# Patient Record
Sex: Male | Born: 1955
Health system: Southern US, Community
[De-identification: ages and names within clinical notes are randomized; demographics above are authoritative.]

## PROBLEM LIST (undated history)

## (undated) DIAGNOSIS — G4700 Insomnia, unspecified: Secondary | ICD-10-CM

## (undated) DIAGNOSIS — Z973 Presence of spectacles and contact lenses: Secondary | ICD-10-CM

## (undated) DIAGNOSIS — T4145XA Adverse effect of unspecified anesthetic, initial encounter: Secondary | ICD-10-CM

## (undated) DIAGNOSIS — E039 Hypothyroidism, unspecified: Secondary | ICD-10-CM

## (undated) DIAGNOSIS — M5020 Other cervical disc displacement, unspecified cervical region: Secondary | ICD-10-CM

## (undated) DIAGNOSIS — K6289 Other specified diseases of anus and rectum: Secondary | ICD-10-CM

## (undated) DIAGNOSIS — E291 Testicular hypofunction: Secondary | ICD-10-CM

## (undated) DIAGNOSIS — T8859XA Other complications of anesthesia, initial encounter: Secondary | ICD-10-CM

## (undated) DIAGNOSIS — M5030 Other cervical disc degeneration, unspecified cervical region: Secondary | ICD-10-CM

## (undated) DIAGNOSIS — E785 Hyperlipidemia, unspecified: Secondary | ICD-10-CM

## (undated) DIAGNOSIS — D649 Anemia, unspecified: Secondary | ICD-10-CM

## (undated) DIAGNOSIS — K449 Diaphragmatic hernia without obstruction or gangrene: Secondary | ICD-10-CM

## (undated) DIAGNOSIS — M1990 Unspecified osteoarthritis, unspecified site: Secondary | ICD-10-CM

## (undated) DIAGNOSIS — N529 Male erectile dysfunction, unspecified: Secondary | ICD-10-CM

## (undated) HISTORY — PX: TONSILLECTOMY: SUR1361

## (undated) HISTORY — PX: TOTAL HIP ARTHROPLASTY: SHX124

## (undated) HISTORY — PX: CARDIOVASCULAR STRESS TEST: SHX262

---

## 2016-05-22 ENCOUNTER — Other Ambulatory Visit: Payer: Self-pay | Admitting: Family Medicine

## 2016-05-22 ENCOUNTER — Other Ambulatory Visit (HOSPITAL_COMMUNITY): Payer: Self-pay | Admitting: Family Medicine

## 2016-05-22 DIAGNOSIS — R1011 Right upper quadrant pain: Secondary | ICD-10-CM

## 2016-05-23 ENCOUNTER — Ambulatory Visit (HOSPITAL_COMMUNITY): Payer: 59

## 2016-05-26 ENCOUNTER — Other Ambulatory Visit: Payer: Self-pay

## 2016-05-31 ENCOUNTER — Ambulatory Visit (HOSPITAL_COMMUNITY)
Admission: RE | Admit: 2016-05-31 | Discharge: 2016-05-31 | Disposition: A | Discharge status: Home / Self Care | Payer: 59 | Source: Ambulatory Visit | Attending: Family Medicine | Admitting: Family Medicine

## 2016-05-31 DIAGNOSIS — R1013 Epigastric pain: Secondary | ICD-10-CM | POA: Diagnosis not present

## 2016-05-31 DIAGNOSIS — R1011 Right upper quadrant pain: Secondary | ICD-10-CM

## 2016-07-03 DIAGNOSIS — R6881 Early satiety: Secondary | ICD-10-CM | POA: Diagnosis not present

## 2016-07-03 DIAGNOSIS — R110 Nausea: Secondary | ICD-10-CM | POA: Diagnosis not present

## 2016-07-03 DIAGNOSIS — R1013 Epigastric pain: Secondary | ICD-10-CM | POA: Diagnosis not present

## 2016-07-03 DIAGNOSIS — R197 Diarrhea, unspecified: Secondary | ICD-10-CM | POA: Diagnosis not present

## 2016-07-03 DIAGNOSIS — R140 Abdominal distension (gaseous): Secondary | ICD-10-CM | POA: Diagnosis not present

## 2016-07-06 DIAGNOSIS — R197 Diarrhea, unspecified: Secondary | ICD-10-CM | POA: Diagnosis not present

## 2016-07-06 DIAGNOSIS — E785 Hyperlipidemia, unspecified: Secondary | ICD-10-CM | POA: Diagnosis not present

## 2016-07-06 DIAGNOSIS — E039 Hypothyroidism, unspecified: Secondary | ICD-10-CM | POA: Diagnosis not present

## 2016-07-06 DIAGNOSIS — E291 Testicular hypofunction: Secondary | ICD-10-CM | POA: Diagnosis not present

## 2016-07-06 DIAGNOSIS — R5383 Other fatigue: Secondary | ICD-10-CM | POA: Diagnosis not present

## 2016-07-06 MED FILL — LEVOTHYROXINE 25 MCG TABLET: 25 | 90 days supply | Qty: 90 | Fill #0

## 2016-07-07 MED FILL — TESTOSTERON CYP 2,000 MG/10: 200 | 28 days supply | Qty: 10 | Fill #0

## 2016-07-10 ENCOUNTER — Encounter (HOSPITAL_COMMUNITY): Payer: Self-pay | Admitting: *Deleted

## 2016-07-10 MED FILL — GAVILYTE-N SOLUTION: 420 | 1 days supply | Qty: 4000 | Fill #0

## 2016-07-10 NOTE — Progress Notes (Signed)
Pt denies cardiac history or chest pain. States he sometimes has shortness of breath, but can't relate it to anything specific. States it doesn't happen often.

## 2016-07-11 ENCOUNTER — Ambulatory Visit (HOSPITAL_COMMUNITY): Payer: 59 | Admitting: Certified Registered Nurse Anesthetist

## 2016-07-11 ENCOUNTER — Encounter (HOSPITAL_COMMUNITY): Payer: Self-pay

## 2016-07-11 ENCOUNTER — Encounter (HOSPITAL_COMMUNITY)
Admission: RE | Disposition: A | Discharge status: Home / Self Care | Payer: Self-pay | Source: Ambulatory Visit | Attending: Gastroenterology

## 2016-07-11 ENCOUNTER — Ambulatory Visit (HOSPITAL_COMMUNITY)
Admission: RE | Admit: 2016-07-11 | Discharge: 2016-07-11 | Disposition: A | Discharge status: Home / Self Care | Payer: 59 | Source: Ambulatory Visit | Attending: Gastroenterology | Admitting: Gastroenterology

## 2016-07-11 ENCOUNTER — Other Ambulatory Visit: Payer: Self-pay | Admitting: Gastroenterology

## 2016-07-11 DIAGNOSIS — Z79899 Other long term (current) drug therapy: Secondary | ICD-10-CM | POA: Insufficient documentation

## 2016-07-11 DIAGNOSIS — K6289 Other specified diseases of anus and rectum: Secondary | ICD-10-CM | POA: Insufficient documentation

## 2016-07-11 DIAGNOSIS — K3189 Other diseases of stomach and duodenum: Secondary | ICD-10-CM | POA: Diagnosis not present

## 2016-07-11 DIAGNOSIS — Z7982 Long term (current) use of aspirin: Secondary | ICD-10-CM | POA: Diagnosis not present

## 2016-07-11 DIAGNOSIS — R197 Diarrhea, unspecified: Secondary | ICD-10-CM | POA: Diagnosis not present

## 2016-07-11 DIAGNOSIS — R1013 Epigastric pain: Secondary | ICD-10-CM | POA: Insufficient documentation

## 2016-07-11 DIAGNOSIS — K2960 Other gastritis without bleeding: Secondary | ICD-10-CM | POA: Diagnosis not present

## 2016-07-11 HISTORY — PX: ESOPHAGOGASTRODUODENOSCOPY (EGD) WITH PROPOFOL: SHX5813

## 2016-07-11 HISTORY — DX: Anemia, unspecified: D64.9

## 2016-07-11 HISTORY — PX: COLONOSCOPY WITH PROPOFOL: SHX5780

## 2016-07-11 HISTORY — DX: Other complications of anesthesia, initial encounter: T88.59XA

## 2016-07-11 HISTORY — DX: Adverse effect of unspecified anesthetic, initial encounter: T41.45XA

## 2016-07-11 HISTORY — DX: Unspecified osteoarthritis, unspecified site: M19.90

## 2016-07-11 SURGERY — ESOPHAGOGASTRODUODENOSCOPY (EGD) WITH PROPOFOL
Anesthesia: Monitor Anesthesia Care

## 2016-07-11 MED ORDER — LACTATED RINGERS IV SOLN
INTRAVENOUS | Status: DC
  Administered 2016-07-11: 1000 mL via INTRAVENOUS

## 2016-07-11 MED ORDER — BUTAMBEN-TETRACAINE-BENZOCAINE 2-2-14 % EX AERO
INHALATION_SPRAY | CUTANEOUS | Status: DC | PRN
  Administered 2016-07-11: 2 via TOPICAL

## 2016-07-11 MED ORDER — SODIUM CHLORIDE 0.9 % IV SOLN: INTRAVENOUS | Status: DC

## 2016-07-11 MED ORDER — PHENYLEPHRINE HCL 10 MG/ML IJ SOLN
INTRAMUSCULAR | Status: DC | PRN
  Administered 2016-07-11 (×2): 40 ug via INTRAVENOUS
  Administered 2016-07-11: 80 ug via INTRAVENOUS
  Administered 2016-07-11: 40 ug via INTRAVENOUS

## 2016-07-11 MED ORDER — LIDOCAINE HCL (CARDIAC) 20 MG/ML IV SOLN
INTRAVENOUS | Status: DC | PRN
  Administered 2016-07-11: 40 mg via INTRAVENOUS

## 2016-07-11 MED ORDER — PROPOFOL 500 MG/50ML IV EMUL
INTRAVENOUS | Status: DC | PRN
  Administered 2016-07-11: 150 ug/kg/min via INTRAVENOUS

## 2016-07-11 MED ORDER — PROPOFOL 10 MG/ML IV BOLUS
INTRAVENOUS | Status: DC | PRN
  Administered 2016-07-11 (×2): 50 mg via INTRAVENOUS

## 2016-07-11 NOTE — Discharge Instructions (Signed)
Colonoscopy, Care After °These instructions give you information on caring for yourself after your procedure. Your doctor may also give you more specific instructions. Call your doctor if you have any problems or questions after your procedure. °HOME CARE °· Do not drive for 24 hours. °· Do not sign important papers or use machinery for 24 hours. °· You may shower. °· You may go back to your usual activities, but go slower for the first 24 hours. °· Take rest breaks often during the first 24 hours. °· Walk around or use warm packs on your belly (abdomen) if you have belly cramping or gas. °· Drink enough fluids to keep your pee (urine) clear or pale yellow. °· Resume your normal diet. Avoid heavy or fried foods. °· Avoid drinking alcohol for 24 hours or as told by your doctor. °· Only take medicines as told by your doctor. °If a tissue sample (biopsy) was taken during the procedure:  °· Do not take aspirin or blood thinners for 7 days, or as told by your doctor. °· Do not drink alcohol for 7 days, or as told by your doctor. °· Eat soft foods for the first 24 hours. °GET HELP IF: °You still have a small amount of blood in your poop (stool) 2-3 days after the procedure. °GET HELP RIGHT AWAY IF: °· You have more than a small amount of blood in your poop. °· You see clumps of tissue (blood clots) in your poop. °· Your belly is puffy (swollen). °· You feel sick to your stomach (nauseous) or throw up (vomit). °· You have a fever. °· You have belly pain that gets worse and medicine does not help. °MAKE SURE YOU: °· Understand these instructions. °· Will watch your condition. °· Will get help right away if you are not doing well or get worse. °  °This information is not intended to replace advice given to you by your health care provider. Make sure you discuss any questions you have with your health care provider. °  °Document Released: 12/23/2010 Document Revised: 11/25/2013 Document Reviewed: 07/28/2013 °Elsevier  Interactive Patient Education ©2016 [employer] °Esophagogastroduodenoscopy, Care After °Refer to this sheet in the next few weeks. These instructions provide you with information about caring for yourself after your procedure. Your health care provider may also give you more specific instructions. Your treatment has been planned according to current medical practices, but problems sometimes occur. Call your health care provider if you have any problems or questions after your procedure. °WHAT TO EXPECT AFTER THE PROCEDURE °After your procedure, it is typical to feel: °· Soreness in your throat. °· Pain with swallowing. °· Sick to your stomach (nauseous). °· Bloated. °· Dizzy. °· Fatigued. °HOME CARE INSTRUCTIONS °· Do not eat or drink anything until the numbing medicine (local anesthetic) has worn off and your gag reflex has returned. You will know that the local anesthetic has worn off when you can swallow comfortably. °· Do not drive or operate machinery until directed by your health care provider. °· Take medicines only as directed by your health care provider. °SEEK MEDICAL CARE IF:  °· You cannot stop coughing. °· You are not urinating at all or less than usual. °SEEK IMMEDIATE MEDICAL CARE IF: °· You have difficulty swallowing. °· You cannot eat or drink. °· You have worsening throat or chest pain. °· You have dizziness or lightheadedness or you faint. °· You have nausea or vomiting. °· You have chills. °· You have a fever. °·   You have severe abdominal pain. °· You have black, tarry, or bloody stools. °  °This information is not intended to replace advice given to you by your health care provider. Make sure you discuss any questions you have with your health care provider. °  °Document Released: 11/06/2012 Document Revised: 12/11/2014 Document Reviewed: 11/06/2012 °Elsevier Interactive Patient Education ©2016 [employer] ° °

## 2016-07-11 NOTE — Op Note (Signed)
[facility] - Inpatient Patient Name: Carl Hogan Procedure Date : 07/11/2016 MRN: [id number] Attending MD: Wonda Horner , MD Date of Birth: 1956/03/12 CSN: [id number] Age: 60 Admit Type: Outpatient Procedure:                Upper GI endoscopy Indications:              Epigastric abdominal pain Providers:                Wonda Horner, MD, Cleda Daub, RN, Alfonso Patten,                            Technician, Cira Servant, CRNA Referring MD:              Medicines:                Propofol per Anesthesia Complications:            No immediate complications. Estimated Blood Loss:     Estimated blood loss was minimal. Procedure:                Pre-Anesthesia Assessment:                           - Prior to the procedure, a History and Physical                            was performed, and patient medications and                            allergies were reviewed. [name] tolerance of                            previous anesthesia was also reviewed. The risks                            and benefits of the procedure and the sedation                            options and risks were discussed with the patient.                            All questions were answered, and informed consent                            was obtained. Prior Anticoagulants: The patient has                            taken no previous anticoagulant or antiplatelet                            agents. ASA Grade Assessment: II - A patient with                            mild systemic disease. After reviewing the risks  and benefits, the patient was deemed in                            satisfactory condition to undergo the procedure.                           After obtaining informed consent, the endoscope was                            passed under direct vision. Throughout the                            procedure, the patient's blood pressure, pulse, and   oxygen saturations were monitored continuously. The                            [id number]) scope was introduced through the                            mouth, and advanced to the second part of duodenum.                            The upper GI endoscopy was accomplished without                            difficulty. The patient tolerated the procedure                            well. Scope In: Scope Out: Findings:      The examined esophagus was normal.      Mildly erythematous mucosa was found in the gastric antrum. Biopsies       were taken with a cold forceps for histology.      The examined duodenum was normal. Biopsies were taken with a cold       forceps for histology. Impression:               - Normal esophagus.                           - Erythematous mucosa in the antrum. Biopsied.                           - Normal examined duodenum. Biopsied. Moderate Sedation:      . Recommendation:           - Resume regular diet.                           - Continue present medications.                           - Await pathology results.                           - Return to physician assistant as previously  scheduled. Procedure Code(s):        --- Professional ---                           [phone], Esophagogastroduodenoscopy, flexible,                            transoral; with biopsy, single or multiple Diagnosis Code(s):        --- Professional ---                           K31.89, Other diseases of stomach and duodenum                           R10.13, Epigastric pain CPT copyright 2016 [employer]. All rights reserved. The codes documented in this report are preliminary and upon coder review may  be revised to meet current compliance requirements. Wonda Horner, MD 07/11/2016 9:28:23 AM This report has been signed electronically. Number of Addenda: 0

## 2016-07-11 NOTE — Anesthesia Preprocedure Evaluation (Signed)
Anesthesia Evaluation  Patient identified by MRN, date of birth, ID band Patient awake    Reviewed: Allergy & Precautions, NPO status , Patient's Chart, lab work & pertinent test results  Airway Mallampati: II  TM Distance: >3 FB Neck ROM: Full    Dental no notable dental hx.    Pulmonary shortness of breath,    Pulmonary exam normal breath sounds clear to auscultation       Cardiovascular negative cardio ROS Normal cardiovascular exam Rhythm:Regular Rate:Normal     Neuro/Psych negative neurological ROS  negative psych ROS   GI/Hepatic Neg liver ROS, hiatal hernia,   Endo/Other  negative endocrine ROS  Renal/GU negative Renal ROS     Musculoskeletal  (+) Arthritis ,   Abdominal   Peds  Hematology negative hematology ROS (+) anemia ,   Anesthesia Other Findings   Reproductive/Obstetrics negative OB ROS                             Anesthesia Physical Anesthesia Plan  ASA: III  Anesthesia Plan: MAC   Post-op Pain Management:    Induction: Intravenous  Airway Management Planned:   Additional Equipment:   Intra-op Plan:   Post-operative Plan:   Informed Consent: I have reviewed the patients History and Physical, chart, labs and discussed the procedure including the risks, benefits and alternatives for the proposed anesthesia with the patient or authorized representative who has indicated his/her understanding and acceptance.   Dental advisory given  Plan Discussed with: CRNA  Anesthesia Plan Comments:         Anesthesia Quick Evaluation

## 2016-07-11 NOTE — H&P (Signed)
  Patient came to endo for EGD and colonoscopy for epigastric pain and diarrhea. History reviewed. PE: alert, no distress. Heart RRR, Lungs clear, Abdomen Non tender. Plan, EGD and colonoscopy

## 2016-07-11 NOTE — Anesthesia Postprocedure Evaluation (Signed)
Anesthesia Post Note  Patient: Carl Hogan  Procedure(s) Performed: Procedure(s) (LRB): ESOPHAGOGASTRODUODENOSCOPY (EGD) WITH PROPOFOL (N/A) COLONOSCOPY WITH PROPOFOL (N/A)  Patient location during evaluation: PACU Anesthesia Type: MAC Level of consciousness: awake and alert Pain management: pain level controlled Vital Signs Assessment: post-procedure vital signs reviewed and stable Respiratory status: spontaneous breathing Cardiovascular status: stable Anesthetic complications: no    Last Vitals:  Vitals:   07/11/16 1000 07/11/16 1006  BP: 114/73 126/77  Pulse: 66 72  Resp: 12 11  Temp:      Last Pain:  Vitals:   07/11/16 0936  TempSrc: Oral                 Nolon Nations

## 2016-07-11 NOTE — Transfer of Care (Signed)
Immediate Anesthesia Transfer of Care Note  Patient: Carl Hogan  Procedure(s) Performed: Procedure(s): ESOPHAGOGASTRODUODENOSCOPY (EGD) WITH PROPOFOL (N/A) COLONOSCOPY WITH PROPOFOL (N/A)  Patient Location: PACU and Endoscopy Unit  Anesthesia Type:MAC  Level of Consciousness: awake, alert , oriented and patient cooperative  Airway & Oxygen Therapy: Patient Spontanous Breathing and Patient connected to nasal cannula oxygen  Post-op Assessment: Report given to RN and Post -op Vital signs reviewed and stable  Post vital signs: Reviewed and stable  Last Vitals:  Vitals:   07/11/16 0755  BP: 130/72  Pulse: 75  Resp: 13    Last Pain: There were no vitals filed for this visit.       Complications: No apparent anesthesia complications

## 2016-07-11 NOTE — Op Note (Signed)
[facility] Patient Name: Carl Hogan Procedure Date : 07/11/2016 MRN: [id number] Attending MD: Wonda Horner , MD Date of Birth: [birth year] CSN: [id number] Age: 60 Admit Type: Outpatient Procedure:                Colonoscopy Indications:              Clinically significant diarrhea of unexplained                            origin Providers:                Wonda Horner, MD, Cleda Daub, RN, Alfonso Patten,                            Technician, Cira Servant, CRNA Referring MD:              Medicines:                Propofol per Anesthesia Complications:            No immediate complications. Estimated Blood Loss:     Estimated blood loss was minimal. Procedure:                Pre-Anesthesia Assessment:                           - Prior to the procedure, a History and Physical                            was performed, and patient medications and                            allergies were reviewed. [name] tolerance of                            previous anesthesia was also reviewed. The risks                            and benefits of the procedure and the sedation                            options and risks were discussed with the patient.                            All questions were answered, and informed consent                            was obtained. Prior Anticoagulants: The patient has                            taken no previous anticoagulant or antiplatelet                            agents. ASA Grade Assessment: II - A patient with  mild systemic disease. After reviewing the risks                            and benefits, the patient was deemed in                            satisfactory condition to undergo the procedure.                           - Prior to the procedure, a History and Physical                            was performed, and patient medications and                            allergies were reviewed. The  patient's tolerance of                            previous anesthesia was also reviewed. The risks                            and benefits of the procedure and the sedation                            options and risks were discussed with the patient.                            All questions were answered, and informed consent                            was obtained. Prior Anticoagulants: The patient has                            taken no previous anticoagulant or antiplatelet                            agents. ASA Grade Assessment: II - A patient with                            mild systemic disease. After reviewing the risks                            and benefits, the patient was deemed in                            satisfactory condition to undergo the procedure.                           After obtaining informed consent, the colonoscope                            was passed under direct vision. Throughout the  procedure, the patient's blood pressure, pulse, and                            oxygen saturations were monitored continuously. The                            [id number] scope was introduced through                            the anus and advanced to the the cecum, identified                            by appendiceal orifice and ileocecal valve. The                            ileocecal valve, appendiceal orifice, and rectum                            were photographed. The quality of the bowel                            preparation was good. Scope In: 9:10:50 AM Scope Out: 9:21:24 AM Scope Withdrawal Time: 0 hours 7 minutes 18 seconds  Total Procedure Duration: 0 hours 10 minutes 34 seconds  Findings:      The digital rectal exam was abnormal. There was a soft rubbery nodule in       the anal canal      The colon (entire examined portion) appeared normal. Biopsies for       histology were taken with a cold forceps from the randomly for        evaluation of microscopic colitis.      Nodule in anal canal. Impression:               - Abnormal digital rectal exam.                           - The entire examined colon is normal. Biopsied. Moderate Sedation:      . Recommendation:           - Resume regular diet.                           - Continue present medications.                           - Await pathology results.                           - Repeat colonoscopy in 10 years for screening                            purposes. Procedure Code(s):        --- Professional ---                           [phone], Colonoscopy, flexible; with biopsy, single  or multiple Diagnosis Code(s):        --- Professional ---                           K62.89, Other specified diseases of anus and rectum                           R19.7, Diarrhea, unspecified CPT copyright 2016 [employer]. All rights reserved. The codes documented in this report are preliminary and upon coder review may  be revised to meet current compliance requirements. Wonda Horner, MD 07/11/2016 9:52:46 AM This report has been signed electronically. Number of Addenda: 0

## 2016-07-22 ENCOUNTER — Encounter (HOSPITAL_COMMUNITY): Payer: Self-pay

## 2016-07-22 ENCOUNTER — Emergency Department (HOSPITAL_COMMUNITY)
Admission: EM | Admit: 2016-07-22 | Discharge: 2016-07-23 | Disposition: A | Discharge status: Home / Self Care | Payer: 59 | Attending: Emergency Medicine | Admitting: Emergency Medicine

## 2016-07-22 ENCOUNTER — Emergency Department (HOSPITAL_COMMUNITY): Payer: 59

## 2016-07-22 DIAGNOSIS — R05 Cough: Secondary | ICD-10-CM | POA: Diagnosis not present

## 2016-07-22 DIAGNOSIS — R0602 Shortness of breath: Secondary | ICD-10-CM | POA: Insufficient documentation

## 2016-07-22 DIAGNOSIS — Z7982 Long term (current) use of aspirin: Secondary | ICD-10-CM | POA: Insufficient documentation

## 2016-07-22 DIAGNOSIS — R0789 Other chest pain: Secondary | ICD-10-CM | POA: Diagnosis not present

## 2016-07-22 DIAGNOSIS — R072 Precordial pain: Secondary | ICD-10-CM | POA: Diagnosis not present

## 2016-07-22 DIAGNOSIS — Z79899 Other long term (current) drug therapy: Secondary | ICD-10-CM | POA: Diagnosis not present

## 2016-07-22 DIAGNOSIS — R079 Chest pain, unspecified: Secondary | ICD-10-CM | POA: Diagnosis not present

## 2016-07-22 DIAGNOSIS — R110 Nausea: Secondary | ICD-10-CM | POA: Insufficient documentation

## 2016-07-22 LAB — BASIC METABOLIC PANEL
ANION GAP: 7 (ref 5–15)
BUN: 19 mg/dL (ref 6–20)
CALCIUM: 9.6 mg/dL (ref 8.9–10.3)
CO2: 25 mmol/L (ref 22–32)
Chloride: 105 mmol/L (ref 101–111)
Creatinine, Ser: 1.31 mg/dL — ABNORMAL HIGH (ref 0.61–1.24)
GFR calc non Af Amer: 58 mL/min — ABNORMAL LOW (ref >60)
Glucose, Bld: 103 mg/dL — ABNORMAL HIGH (ref 65–99)
Potassium: 3.7 mmol/L (ref 3.5–5.1)
SODIUM: 137 mmol/L (ref 135–145)

## 2016-07-22 LAB — D-DIMER, QUANTITATIVE (NOT AT ARMC)

## 2016-07-22 LAB — CBC
HCT: 44.5 % (ref 39.0–52.0)
HEMOGLOBIN: 14.9 g/dL (ref 13.0–17.0)
MCH: 29.3 pg (ref 26.0–34.0)
MCHC: 33.5 g/dL (ref 30.0–36.0)
MCV: 87.6 fL (ref 78.0–100.0)
Platelets: 244 10*3/uL (ref 150–400)
RBC: 5.08 MIL/uL (ref 4.22–5.81)
RDW: 12.7 % (ref 11.5–15.5)
WBC: 7.2 10*3/uL (ref 4.0–10.5)

## 2016-07-22 LAB — I-STAT TROPONIN, ED: TROPONIN I, POC: 0.01 ng/mL (ref 0.00–0.08)

## 2016-07-22 MED ORDER — ASPIRIN 81 MG PO CHEW
324.0000 mg | CHEWABLE_TABLET | Freq: Once | ORAL | Status: AC
  Administered 2016-07-22: 324 mg via ORAL
  Filled 2016-07-22: qty 4

## 2016-07-22 NOTE — ED Provider Notes (Signed)
[facility] DEPT Provider Note   CSN: [id number] Arrival date & time: 07/22/16  1916     History   Chief Complaint Chief Complaint  Patient presents with  . Chest Pain    HPI Carl Hogan is a 60 y.o. male.  The history is provided by the patient.  Chest Pain   This is a new problem. The current episode started 3 to 5 hours ago. Episode frequency: fluctuating. The problem has been gradually improving. The pain is associated with rest. The pain is present in the substernal region. The pain radiates to the left shoulder and left arm. Associated symptoms include cough (non-productive), nausea and shortness of breath. Pertinent negatives include no abdominal pain, no back pain, no diaphoresis, no headaches, no irregular heartbeat and no lower extremity edema. Risk factors include male gender.  Pertinent negatives for past medical history include no CAD, no CHF, no diabetes, no DVT, no hyperlipidemia, no hypertension, no MI and no strokes.  Procedure history is positive for exercise treadmill test (2007; negative).  Procedure history is negative for cardiac catheterization.   Patient also reported a recent EGD 8 days ago without any complications the days to follow.   Past Medical History:  Diagnosis Date  . Anemia   . Arthritis   . Complication of anesthesia    was in recovery had Morphine and has anaphylactic reaction. Also slow to wake up  . Diarrhea   . History of hiatal hernia   . Shortness of breath dyspnea    occasional    There are no active problems to display for this patient.   Past Surgical History:  Procedure Laterality Date  . COLONOSCOPY    . COLONOSCOPY WITH PROPOFOL N/A 07/11/2016   Procedure: COLONOSCOPY WITH PROPOFOL;  Surgeon: Wonda Horner, MD;  Location[facility] ENDOSCOPY;  Service: Endoscopy;  Laterality: N/A;  . ESOPHAGOGASTRODUODENOSCOPY (EGD) WITH PROPOFOL N/A 07/11/2016   Procedure: ESOPHAGOGASTRODUODENOSCOPY (EGD) WITH PROPOFOL;  Surgeon: Wonda Horner, MD;  Location: [facility] ENDOSCOPY;  Service: Endoscopy;  Laterality: N/A;  . JOINT REPLACEMENT Bilateral    hips  . TONSILLECTOMY         Home Medications    Prior to Admission medications   Medication Sig Start Date End Date Taking? Authorizing Provider  aspirin 81 MG tablet Take 81 mg by mouth daily.   Yes Historical Provider, MD  b complex vitamins tablet Take 1 tablet by mouth daily.   Yes Historical Provider, MD  Calcium-Magnesium-Vitamin D (CALCIUM MAGNESIUM PO) Take 1 tablet by mouth daily.   Yes Historical Provider, MD  Cholecalciferol (VITAMIN D3) 5000 units TABS Take 1 tablet by mouth every morning.    Yes Historical Provider, MD  COPPER PO Take 1 mg by mouth daily.   Yes Historical Provider, MD  levothyroxine (SYNTHROID, LEVOTHROID) 25 MCG tablet Take 25 mcg by mouth daily before breakfast.   Yes Historical Provider, MD  omega-3 acid ethyl esters (LOVAZA) 1 g capsule Take 1 g by mouth daily.   Yes Historical Provider, MD  tadalafil (CIALIS) 20 MG tablet Take 20 mg by mouth daily as needed for erectile dysfunction.   Yes Historical Provider, MD  testosterone cypionate (DEPOTESTOTERONE CYPIONATE) 100 MG/ML injection Inject 100 mg into the muscle once a week. For IM use only; inject on Mondays   Yes Historical Provider, MD  Zinc Sulfate (ZINC 15 PO) Take 1 tablet by mouth daily.   Yes Historical Provider, MD  zolpidem (AMBIEN) 5 MG tablet Take 5 mg  by mouth at bedtime as needed for sleep.   Yes Historical Provider, MD    Family History Family History  Problem Relation Age of Onset  . Edema Mother   . Heart disease Father     Social History Social History  Substance Use Topics  . Smoking status: Never Smoker  . Smokeless tobacco: Never Used  . Alcohol use 3.0 oz/week    5 Glasses of wine per week     Allergies   Morphine and related and Lactose intolerance (gi)   Review of Systems Review of Systems  Constitutional: Negative for diaphoresis.  HENT: Negative  for congestion and rhinorrhea.   Respiratory: Positive for cough (non-productive) and shortness of breath.   Cardiovascular: Positive for chest pain.  Gastrointestinal: Positive for nausea. Negative for abdominal pain and diarrhea.  Genitourinary: Negative for difficulty urinating.  Musculoskeletal: Negative for back pain.  Neurological: Negative for headaches.  All other systems reviewed and are negative.    Physical Exam Updated Vital Signs BP 151/93 (BP Location: Right Arm)   Pulse 85   Temp 99 F (37.2 C) (Oral)   Resp 16   Ht 5\' 10"  (1.778 m)   Wt 205 lb (93 kg)   SpO2 96%   BMI 29.41 kg/m   Physical Exam  Constitutional: He is oriented to person, place, and time. He appears well-nourished. No distress.  HENT:  Head: Normocephalic and atraumatic.  Right Ear: External ear normal.  Left Ear: External ear normal.  Eyes: Pupils are equal, round, and reactive to light. Right eye exhibits no discharge. Left eye exhibits no discharge. No scleral icterus.  Neck: Normal range of motion. Neck supple.  Cardiovascular: Normal rate.  Exam reveals no gallop and no friction rub.   No murmur heard. Pulmonary/Chest: Effort normal and breath sounds normal. No stridor. No respiratory distress. He has no wheezes. He has no rales. He exhibits no tenderness.  Abdominal: Soft. He exhibits no distension and no mass. There is no tenderness. There is no rebound and no guarding.  Musculoskeletal: He exhibits no edema or tenderness.  Neurological: He is alert and oriented to person, place, and time.  Skin: Skin is warm and dry. No rash noted. He is not diaphoretic. No erythema.     ED Treatments / Results  Labs (all labs ordered are listed, but only abnormal results are displayed) Labs Reviewed  BASIC METABOLIC PANEL - Abnormal; Notable for the following:       Result Value   Glucose, Bld 103 (*)    Creatinine, Ser 1.31 (*)    GFR calc non Af Amer 58 (*)    All other components within  normal limits  CBC  D-DIMER, QUANTITATIVE (NOT AT [facility])  I-STAT TROPOININ, ED  I-STAT TROPOININ, ED    EKG  EKG Interpretation  Date/Time:  Saturday July 22 2016 19:25:03 EDT Ventricular Rate:  89 PR Interval:    QRS Duration: 104 QT Interval:  343 QTC Calculation: 418 R Axis:   73 Text Interpretation:  Sinus rhythm Low voltage, precordial leads no prior tracing to compare Confirmed by [facility] MD, PEDRO ([id number]) on 07/22/2016 9:17:01 PM       Radiology Dg Chest 2 View  Result Date: 07/22/2016 CLINICAL DATA:  Chest pain. EXAM: CHEST  2 VIEW COMPARISON:  None. FINDINGS: The heart size and mediastinal contours are within normal limits. Both lungs are clear. The visualized skeletal structures are unremarkable. IMPRESSION: No active cardiopulmonary disease. Electronically Signed   By: Shanon Brow  Jimmye Norman III M.D   On: 07/22/2016 20:10    Procedures Procedures (including critical care time)  Medications Ordered in ED Medications  aspirin chewable tablet 324 mg (324 mg Oral Given 07/22/16 2222)     Initial Impression / Assessment and Plan / ED Course  I have reviewed the triage vital signs and the nursing notes.  Pertinent labs & imaging results that were available during my care of the patient were reviewed by me and considered in my medical decision making (see chart for details).  Clinical Course    Patient is currently complaining only of mild pain and declined any nitroglycerin or pain medication. ASA given. EKG without acute ischemic changes. Chest x-ray without evidence suggestive of pneumonia, pneumothorax, pneumomediastinum.  No abnormal contour of the mediastinum to suggest dissection. No evidence of acute injuries. HEAR score of 3. Troponins negative 2.  Patient reports being on testosterone injections. Dimer negative. Low suspicion for pulmonary embolism.  Chest x-ray without any evidence of pneumomediastinum to suggest perforated esophagus secondary to the  EGD.  Presentation not classic for aortic dissection.  Patient is safe for discharge with strict return precautions. Patient is to follow-up with his primary care provider for further workup and repeat of a stress test.  Final Clinical Impressions(s) / ED Diagnoses   Final diagnoses:  Chest pain, unspecified chest pain type   Disposition: Discharge  Condition: Good  I have discussed the results, Dx and Tx plan with the patient who expressed understanding and agree(s) with the plan. Discharge instructions discussed at great length. The patient was given strict return precautions who verbalized understanding of the instructions. No further questions at time of discharge.    Current Discharge Medication List      Follow Up: Aretta Nip, MD [place] [address] [phone]  Schedule an appointment as soon as possible for a visit  Within the next 30 days to schedule stress test and further evaluate your chest pain.      Fatima Blank, MD 07/23/16 [phone]

## 2016-07-22 NOTE — ED Triage Notes (Signed)
PT C/O EPIGASTRIC PAIN RADIATING TO THE RIGHT AND LEFT CHEST WITH SOB AND NAUSEA X1.5 HRS AGO.

## 2016-07-23 DIAGNOSIS — Z7982 Long term (current) use of aspirin: Secondary | ICD-10-CM | POA: Diagnosis not present

## 2016-07-23 DIAGNOSIS — Z79899 Other long term (current) drug therapy: Secondary | ICD-10-CM | POA: Diagnosis not present

## 2016-07-23 DIAGNOSIS — R0602 Shortness of breath: Secondary | ICD-10-CM | POA: Diagnosis not present

## 2016-07-23 DIAGNOSIS — R0789 Other chest pain: Secondary | ICD-10-CM | POA: Diagnosis not present

## 2016-07-23 DIAGNOSIS — R110 Nausea: Secondary | ICD-10-CM | POA: Diagnosis not present

## 2016-07-23 DIAGNOSIS — R05 Cough: Secondary | ICD-10-CM | POA: Diagnosis not present

## 2016-07-23 LAB — I-STAT TROPONIN, ED: TROPONIN I, POC: 0.01 ng/mL (ref 0.00–0.08)

## 2016-07-26 DIAGNOSIS — M9903 Segmental and somatic dysfunction of lumbar region: Secondary | ICD-10-CM | POA: Diagnosis not present

## 2016-07-26 DIAGNOSIS — M5136 Other intervertebral disc degeneration, lumbar region: Secondary | ICD-10-CM | POA: Diagnosis not present

## 2016-07-26 DIAGNOSIS — K620 Anal polyp: Secondary | ICD-10-CM | POA: Diagnosis not present

## 2016-07-26 DIAGNOSIS — M50321 Other cervical disc degeneration at C4-C5 level: Secondary | ICD-10-CM | POA: Diagnosis not present

## 2016-07-26 DIAGNOSIS — M50123 Cervical disc disorder at C6-C7 level with radiculopathy: Secondary | ICD-10-CM | POA: Diagnosis not present

## 2016-07-26 DIAGNOSIS — M9901 Segmental and somatic dysfunction of cervical region: Secondary | ICD-10-CM | POA: Diagnosis not present

## 2016-07-26 DIAGNOSIS — M50323 Other cervical disc degeneration at C6-C7 level: Secondary | ICD-10-CM | POA: Diagnosis not present

## 2016-07-26 DIAGNOSIS — Z01818 Encounter for other preprocedural examination: Secondary | ICD-10-CM | POA: Diagnosis not present

## 2016-07-26 DIAGNOSIS — M9902 Segmental and somatic dysfunction of thoracic region: Secondary | ICD-10-CM | POA: Diagnosis not present

## 2016-07-26 DIAGNOSIS — M50322 Other cervical disc degeneration at C5-C6 level: Secondary | ICD-10-CM | POA: Diagnosis not present

## 2016-07-27 ENCOUNTER — Ambulatory Visit: Payer: Self-pay | Admitting: Surgery

## 2016-07-27 NOTE — H&P (Signed)
Dru Oppegard 07/26/2016 1:31 PM Location: [facility] Patient #: [id number] DOB: August 05, 1956 Married / Language: Cleophus Molt / Race: White Male  Patient Care Team: Aretta Nip, MD as PCP - General (Family Medicine) Michael Boston, MD as Consulting Physician (General Surgery) Wonda Horner, MD as Consulting Physician (Gastroenterology)   History of Present Illness Adin Hector MD; 07/27/2016 9:28 AM) The patient is a 60 year old male who presents with anal lesions. Note for "Anal lesions": Patient sent for surgical consultation by his gastroenterologist, Dr. Penelope Coop. Concern for anal canal mass. Possible need for removal.  Healthy active male. Rather physically active. Does not smoke. No prior surgeries. No prior anorectal issues. Underwent colonoscopy. A mass in the anal canal concerning. Pedunculated. Surgical consultation requested to consider removal of the suspicious mass. He patient usually has a bowel movement about three or four times a day. He walks about a half almost every day. No cardiopulmonary issues.  No personal nor family history of GI/colon cancer, inflammatory bowel disease, irritable bowel syndrome, allergy such as Celiac Sprue, dietary/dairy problems, colitis, ulcers nor gastritis. No recent sick contacts/gastroenteritis. No travel outside the country. No changes in diet. No dysphagia to solids or liquids. No significant heartburn or reflux. No hematochezia, hematemesis, coffee ground emesis. No evidence of prior gastric/peptic ulceration.   Other Problems Elbert Ewings, CMA; 07/26/2016 1:31 PM) Arthritis Back Pain Chest pain Gastric Ulcer Hypercholesterolemia Thyroid Disease  Past Surgical History Elbert Ewings, CMA; 07/26/2016 1:31 PM) Hip Surgery Bilateral.  Diagnostic Studies History Elbert Ewings, CMA; 07/26/2016 1:31 PM) Colonoscopy within last year  Allergies Elbert Ewings, CMA; 07/26/2016 1:31 PM) Morphine Sulfate IR  *ANALGESICS - OPIOID* Anaphylaxis.  Medication History Elbert Ewings, CMA; 07/26/2016 1:35 PM) Levothyroxine Sodium (50MCG Capsule, Oral) Active. Testosterone Cypionate (100MG /ML Solution, Intramuscular .5 ML once a week) Active. Cialis (20MG  Tablet, Oral) Active. Ambien (5MG  Tablet, Oral) Active. B Complex (Oral) Active. Niacin (100MG  Tablet, Oral) Active. Folic Acid (Q000111Q Tablet, Oral) Active. Biotin (1000MCG Tablet, Oral) Active. Zinc (15MG  Capsule, Oral) Active. Copper (5MG  Capsule, Oral) Active. Calcium (500MG  Tablet, Oral) Active. Magnesium (250MG  Tablet, Oral) Active. Coconut Oil (1000MG  Capsule, Oral) Active. Aspirin (81MG  Tablet, Oral) Active. Medications Reconciled  Social History Elbert Ewings, [place]; 07/26/2016 1:31 PM) Alcohol use Moderate alcohol use. Caffeine use Carbonated beverages, Coffee, Tea. No drug use Tobacco use Never smoker.  Family History Elbert Ewings, [place]; 07/26/2016 1:31 PM) Arthritis Father, Mother, Sister. Depression Sister.     Review of Systems Elbert Ewings CMA; 07/26/2016 1:31 PM) General Present- Fatigue and Weight Gain. Not Present- Appetite Loss, Chills, Fever, Night Sweats and Weight Loss. Skin Present- Dryness. Not Present- Change in Wart/Mole, Hives, Jaundice, New Lesions, Non-Healing Wounds, Rash and Ulcer. HEENT Present- Hoarseness, Ringing in the Ears and Wears glasses/contact lenses. Not Present- Earache, Hearing Loss, Nose Bleed, Oral Ulcers, Seasonal Allergies, Sinus Pain, Sore Throat, Visual Disturbances and Yellow Eyes. Respiratory Present- Chronic Cough and Snoring. Not Present- Bloody sputum, Difficulty Breathing and Wheezing. Breast Not Present- Breast Mass, Breast Pain, Nipple Discharge and Skin Changes. Cardiovascular Present- Chest Pain, Shortness of Breath and Swelling of Extremities. Not Present- Difficulty Breathing Lying Down, Leg Cramps, Palpitations and Rapid Heart Rate. Gastrointestinal Present-  Abdominal Pain, Bloating, Gets full quickly at meals and Nausea. Not Present- Bloody Stool, Change in Bowel Habits, Chronic diarrhea, Constipation, Difficulty Swallowing, Excessive gas, Hemorrhoids, Indigestion, Rectal Pain and Vomiting. Male Genitourinary Not Present- Blood in Urine, Change in Urinary Stream, Frequency, Impotence, Nocturia, Painful Urination, Urgency and Urine  Leakage. Musculoskeletal Present- Back Pain, Joint Pain, Joint Stiffness, Muscle Pain, Muscle Weakness and Swelling of Extremities. Neurological Present- Headaches, Tingling and Trouble walking. Not Present- Decreased Memory, Fainting, Numbness, Seizures, Tremor and Weakness. Psychiatric Present- Anxiety. Not Present- Bipolar, Change in Sleep Pattern, Depression, Fearful and Frequent crying. Endocrine Present- Cold Intolerance. Not Present- Excessive Hunger, Hair Changes, Heat Intolerance, Hot flashes and New Diabetes. Hematology Not Present- Blood Thinners, Easy Bruising, Excessive bleeding, Gland problems, HIV and Persistent Infections.  Vitals Elbert Ewings CMA; 07/26/2016 1:36 PM) 07/26/2016 1:35 PM Weight: 205 lb Height: 70in Body Surface Area: 2.11 m Body Mass Index: 29.41 kg/m  Temp.: 98.46F(Temporal)  Pulse: 88 (Regular)  BP: 130/72 (Sitting, Left Arm, Standard)      Physical Exam Adin Hector MD; 07/26/2016 2:18 PM)  General Mental Status-Alert. General Appearance-Not in acute distress, Not Sickly. Orientation-Oriented X3. Hydration-Well hydrated. Voice-Normal.  Integumentary Global Assessment Upon inspection and palpation of skin surfaces of the - Axillae: non-tender, no inflammation or ulceration, no drainage. and Distribution of scalp and body hair is normal. General Characteristics Temperature - normal warmth is noted.  Head and Neck Head-normocephalic, atraumatic with no lesions or palpable masses. Face Global Assessment - atraumatic, no absence of  expression. Neck Global Assessment - no abnormal movements, no bruit auscultated on the right, no bruit auscultated on the left, no decreased range of motion, non-tender. Trachea-midline. Thyroid Gland Characteristics - non-tender.  Eye Eyeball - Left-Extraocular movements intact, No Nystagmus. Eyeball - Right-Extraocular movements intact, No Nystagmus. Cornea - Left-No Hazy. Cornea - Right-No Hazy. Sclera/Conjunctiva - Left-No scleral icterus, No Discharge. Sclera/Conjunctiva - Right-No scleral icterus, No Discharge. Pupil - Left-Direct reaction to light normal. Pupil - Right-Direct reaction to light normal.  ENMT Ears Pinna - Left - no drainage observed, no generalized tenderness observed. Right - no drainage observed, no generalized tenderness observed. Nose and Sinuses External Inspection of the Nose - no destructive lesion observed. Inspection of the nares - Left - quiet respiration. Right - quiet respiration. Mouth and Throat Lips - Upper Lip - no fissures observed, no pallor noted. Lower Lip - no fissures observed, no pallor noted. Nasopharynx - no discharge present. Oral Cavity/Oropharynx - Tongue - no dryness observed. Oral Mucosa - no cyanosis observed. Hypopharynx - no evidence of airway distress observed.  Chest and Lung Exam Inspection Movements - Normal and Symmetrical. Accessory muscles - No use of accessory muscles in breathing. Palpation Palpation of the chest reveals - Non-tender. Auscultation Breath sounds - Normal and Clear.  Cardiovascular Auscultation Rhythm - Regular. Murmurs & Other Heart Sounds - Auscultation of the heart reveals - No Murmurs and No Systolic Clicks.  Abdomen Inspection Inspection of the abdomen reveals - No Visible peristalsis and No Abnormal pulsations. Umbilicus - No Bleeding, No Urine drainage. Palpation/Percussion Palpation and Percussion of the abdomen reveal - Soft, Non Tender, No Rebound tenderness, No  Rigidity (guarding) and No Cutaneous hyperesthesia. Note: Abdomen soft. Nontender, nondistended. No guarding. Perhaps a 2 mm umbilical hernia at the base of the stalk barely noticeable on palpation. Otherwise, no other hernias  Male Genitourinary Sexual Maturity Tanner 5 - Adult hair pattern and Adult penile size and shape. Note: No inguinal hernias. Normal external genitalia. Epididymi, testes, and spermatic cords normal without any masses.  Rectal Note: Right posterior pedunculated mass at the white line. Fibrous and changes to it. 1cm long.   Exam done with assistance of male Medical Assistant in the room. Perianal skin clean with good hygiene. No pruritis ani.  No pilonidal disease. No fissure. No abscess/fistula. Small left lateral external hemorrhoid. Normal sphincter tone. Tolerates digital and anoscopic rectal exam. No other rectal masses.  Peripheral Vascular Upper Extremity Inspection - Left - No Cyanotic nailbeds, Not Ischemic. Right - No Cyanotic nailbeds, Not Ischemic.  Neurologic Neurologic evaluation reveals -normal attention span and ability to concentrate, able to name objects and repeat phrases. Appropriate fund of knowledge , normal sensation and normal coordination. Mental Status Affect - not angry, not paranoid. Cranial Nerves-Normal Bilaterally. Gait-Normal.  Neuropsychiatric Mental status exam performed with findings of-able to articulate well with normal speech/language, rate, volume and coherence, thought content normal with ability to perform basic computations and apply abstract reasoning and no evidence of hallucinations, delusions, obsessions or homicidal/suicidal ideation.  Musculoskeletal Global Assessment Spine, Ribs and Pelvis - no instability, subluxation or laxity. Right Upper Extremity - no instability, subluxation or laxity.  Lymphatic Head & Neck  General Head & Neck Lymphatics: Bilateral - Description - No Localized  lymphadenopathy. Axillary  General Axillary Region: Bilateral - Description - No Localized lymphadenopathy. Femoral & Inguinal  Generalized Femoral & Inguinal Lymphatics: Left - Description - No Localized lymphadenopathy. Right - Description - No Localized lymphadenopathy.   Results Adin Hector MD; 07/27/2016 9:29 AM) Procedures  Name Value Date Hemorrhoids Procedure Other: Right posterior pedunculated mass at the white line. Fibrous and changes to it. 1cm long. Exam done with assistance of male Medical Assistant in the room. Perianal skin clean with good hygiene. No pruritis ani. No pilonidal disease. No fissure. No abscess/fistula. Small left lateral external hemorrhoid. Normal sphincter tone. Tolerates digital and anoscopic rectal exam. No other rectal masses.  Performed: 07/26/2016 2:18 PM    Assessment & Plan Adin Hector MD; 07/27/2016 9:29 AM)  ANAL POLYP (K62.0) Impression: Pedunculated mass at anorectal junction. Perhaps hypertrophic anal papillae vs hemorrhoid versus atypical adenoma, etc.  The safest course is to examination under anesthesia and removal given its moderate size and atypical features. He is interested in proceeding.  This would be an outpatient surgery under general. 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 I did caution he may not be fully recovered and week but we can certainly try and set it up the left possible. Has been a giant lesion but it sensitive area. We work to coordinate a convenient time. Most likely mid-October fall break  Current Plans ANOSCOPY, DIAGNOSTIC [id number]) ENCOUNTER FOR PREOPERATIVE EXAMINATION FOR GENERAL SURGICAL PROCEDURE (Z01.818)  Current Plans You are being scheduled for surgery - Our schedulers will call you.  You should hear from our office's scheduling department within 5 working days about the location, date, and time of surgery. We try to make  accommodations for patient's preferences in scheduling surgery, but sometimes the OR schedule or the surgeon's schedule prevents [place] from making those accommodations.  If you have not heard from our office [phone]) in 5 working days, call the office and ask for your surgeon's nurse.  If you have other questions about your diagnosis, plan, or surgery, call the office and ask for your surgeon's nurse.  Pt Education - [facility] Rectal Prep for Anorectal outpatient/office surgery: discussed with patient and provided information. Pt Education - [facility] Rectal Surgery HCI (Kamila Broda): discussed with patient and provided information.  Adin Hector, M.D., F.A.C.S. Gastrointestinal and Minimally Invasive Surgery [facility] [address] [place], [place] [id number] [phone] Main / Paging

## 2016-08-03 DIAGNOSIS — M50323 Other cervical disc degeneration at C6-C7 level: Secondary | ICD-10-CM | POA: Diagnosis not present

## 2016-08-03 DIAGNOSIS — M50123 Cervical disc disorder at C6-C7 level with radiculopathy: Secondary | ICD-10-CM | POA: Diagnosis not present

## 2016-08-03 DIAGNOSIS — M9901 Segmental and somatic dysfunction of cervical region: Secondary | ICD-10-CM | POA: Diagnosis not present

## 2016-08-03 DIAGNOSIS — M9903 Segmental and somatic dysfunction of lumbar region: Secondary | ICD-10-CM | POA: Diagnosis not present

## 2016-08-03 DIAGNOSIS — M50322 Other cervical disc degeneration at C5-C6 level: Secondary | ICD-10-CM | POA: Diagnosis not present

## 2016-08-03 DIAGNOSIS — M5136 Other intervertebral disc degeneration, lumbar region: Secondary | ICD-10-CM | POA: Diagnosis not present

## 2016-08-03 DIAGNOSIS — M50321 Other cervical disc degeneration at C4-C5 level: Secondary | ICD-10-CM | POA: Diagnosis not present

## 2016-08-03 DIAGNOSIS — M9902 Segmental and somatic dysfunction of thoracic region: Secondary | ICD-10-CM | POA: Diagnosis not present

## 2016-08-21 DIAGNOSIS — E559 Vitamin D deficiency, unspecified: Secondary | ICD-10-CM | POA: Diagnosis not present

## 2016-08-21 DIAGNOSIS — E039 Hypothyroidism, unspecified: Secondary | ICD-10-CM | POA: Diagnosis not present

## 2016-08-21 DIAGNOSIS — E785 Hyperlipidemia, unspecified: Secondary | ICD-10-CM | POA: Diagnosis not present

## 2016-08-21 DIAGNOSIS — R0789 Other chest pain: Secondary | ICD-10-CM | POA: Diagnosis not present

## 2016-08-21 DIAGNOSIS — G44229 Chronic tension-type headache, not intractable: Secondary | ICD-10-CM | POA: Diagnosis not present

## 2016-08-21 DIAGNOSIS — E291 Testicular hypofunction: Secondary | ICD-10-CM | POA: Diagnosis not present

## 2016-08-22 DIAGNOSIS — M9903 Segmental and somatic dysfunction of lumbar region: Secondary | ICD-10-CM | POA: Diagnosis not present

## 2016-08-22 DIAGNOSIS — M9902 Segmental and somatic dysfunction of thoracic region: Secondary | ICD-10-CM | POA: Diagnosis not present

## 2016-08-22 DIAGNOSIS — M9901 Segmental and somatic dysfunction of cervical region: Secondary | ICD-10-CM | POA: Diagnosis not present

## 2016-08-22 DIAGNOSIS — M50323 Other cervical disc degeneration at C6-C7 level: Secondary | ICD-10-CM | POA: Diagnosis not present

## 2016-08-22 DIAGNOSIS — M50321 Other cervical disc degeneration at C4-C5 level: Secondary | ICD-10-CM | POA: Diagnosis not present

## 2016-08-22 DIAGNOSIS — M5136 Other intervertebral disc degeneration, lumbar region: Secondary | ICD-10-CM | POA: Diagnosis not present

## 2016-08-22 DIAGNOSIS — M50123 Cervical disc disorder at C6-C7 level with radiculopathy: Secondary | ICD-10-CM | POA: Diagnosis not present

## 2016-08-22 DIAGNOSIS — M50322 Other cervical disc degeneration at C5-C6 level: Secondary | ICD-10-CM | POA: Diagnosis not present

## 2016-08-23 ENCOUNTER — Other Ambulatory Visit (HOSPITAL_COMMUNITY): Payer: Self-pay | Admitting: Internal Medicine

## 2016-08-23 DIAGNOSIS — R4182 Altered mental status, unspecified: Secondary | ICD-10-CM

## 2016-08-28 ENCOUNTER — Ambulatory Visit (HOSPITAL_COMMUNITY)
Admission: RE | Admit: 2016-08-28 | Discharge: 2016-08-28 | Disposition: A | Discharge status: Home / Self Care | Payer: 59 | Source: Ambulatory Visit | Attending: Internal Medicine | Admitting: Internal Medicine

## 2016-08-28 DIAGNOSIS — R4182 Altered mental status, unspecified: Secondary | ICD-10-CM | POA: Diagnosis not present

## 2016-08-28 DIAGNOSIS — M4802 Spinal stenosis, cervical region: Secondary | ICD-10-CM | POA: Diagnosis not present

## 2016-08-28 MED ORDER — GADOBENATE DIMEGLUMINE 529 MG/ML IV SOLN
20.0000 mL | Freq: Once | INTRAVENOUS | Status: AC | PRN
  Administered 2016-08-28: 20 mL via INTRAVENOUS

## 2016-08-31 ENCOUNTER — Telehealth: Payer: Self-pay | Admitting: Cardiovascular Disease

## 2016-08-31 NOTE — Telephone Encounter (Signed)
Records received from Dr Anastasio Champion for apt on 11/17/7 with Dr Claiborne Billings. Records given to [employer] (medical records) CN

## 2016-09-01 MED FILL — CIALIS 20 MG TABLET: 20 | 30 days supply | Qty: 6 | Fill #0

## 2016-09-19 DIAGNOSIS — H52223 Regular astigmatism, bilateral: Secondary | ICD-10-CM | POA: Diagnosis not present

## 2016-09-19 DIAGNOSIS — H524 Presbyopia: Secondary | ICD-10-CM | POA: Diagnosis not present

## 2016-09-19 DIAGNOSIS — H5213 Myopia, bilateral: Secondary | ICD-10-CM | POA: Diagnosis not present

## 2016-10-03 MED FILL — CIALIS 20 MG TABLET: 20 | 30 days supply | Qty: 6 | Fill #1

## 2016-10-17 MED FILL — LEVOTHYROXINE 25 MCG TABLET: 25 | 30 days supply | Qty: 30 | Fill #0

## 2016-10-20 ENCOUNTER — Encounter: Payer: Self-pay | Admitting: Cardiovascular Disease

## 2016-10-20 ENCOUNTER — Ambulatory Visit (INDEPENDENT_AMBULATORY_CARE_PROVIDER_SITE_OTHER): Payer: 59 | Admitting: Cardiovascular Disease

## 2016-10-20 VITALS — BP 133/86 | HR 90 | Ht 70.0 in | Wt 206.4 lb

## 2016-10-20 DIAGNOSIS — R0789 Other chest pain: Secondary | ICD-10-CM | POA: Diagnosis not present

## 2016-10-20 DIAGNOSIS — N529 Male erectile dysfunction, unspecified: Secondary | ICD-10-CM | POA: Diagnosis not present

## 2016-10-20 DIAGNOSIS — E039 Hypothyroidism, unspecified: Secondary | ICD-10-CM | POA: Diagnosis not present

## 2016-10-20 DIAGNOSIS — E559 Vitamin D deficiency, unspecified: Secondary | ICD-10-CM

## 2016-10-20 DIAGNOSIS — Z01818 Encounter for other preprocedural examination: Secondary | ICD-10-CM

## 2016-10-20 NOTE — Patient Instructions (Signed)
Your physician has requested that you have en exercise stress myoview. For further information please visit [email or web address]. Please follow instruction sheet, as given.   Your physician recommends that you schedule a follow-up appointment in: pending nuclear results.

## 2016-10-22 NOTE — Progress Notes (Signed)
Cardiology Office Note    Date:  10/22/2016   ID:  Carl Hogan, DOB 05/05/1956, MRN [id number]  PCP:  Carl Albee, MD  Cardiologist:  Carl Majestic, MD   Chief Complaint  Patient presents with  . New Evaluation    pt had an episode of chest pain, c/o swelling in legs and feet at times   . 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  He is referred by Dr. Anastasio Hogan for evaluation of recent chest pain and preoperative evaluation.  Carl Hogan is a 60 year old Caucasian male who denies any significant cardiac history.  He has a history of mild hypothyroidism for which he was started on levothyroxine 25 g.  He was also found to have vitamin D deficiency and has been undergoing supplemental vitamin D3 5000 units daily.  There is a history of a hiatal hernia and mild erectile dysfunction.  He had taken*strong injections and several weeks later began to develop somewhat atypical chest discomfort.  He will describe his chest pain as a burning sensation.  Due to recent back problems.  He has not been able to exercise as well as he had in the past.  He was unaware of any definitive exertional precipitation of this chest burning.  He has had some issues with bloating of his stomach.  He recently had undergone a colonoscopy which showed an anal mass.  He tells me he will be undergoing surgery on November 29, 2016 for removal of this mass.  Because of his recent chest pain history.  He is referred for cardiology evaluation.  He denies any shortness of breath.  He is unaware of palpitations.  He denies presyncope or syncope.  He denies PND or orthopnea.   Past Medical History:  Diagnosis Date  . Anemia   . Arthritis   . Complication of anesthesia    was in recovery had Morphine and has anaphylactic reaction. Also slow to wake up  . Diarrhea   . History of hiatal hernia   . Shortness of breath dyspnea    occasional    Past Surgical History:  Procedure Laterality Date  . COLONOSCOPY    . COLONOSCOPY WITH PROPOFOL N/A 07/11/2016   Procedure: COLONOSCOPY WITH PROPOFOL;  Surgeon: Carl Horner, MD;  Location: [facility] ENDOSCOPY;  Service: Endoscopy;  Laterality: N/A;  . ESOPHAGOGASTRODUODENOSCOPY (EGD) WITH PROPOFOL N/A 07/11/2016   Procedure: ESOPHAGOGASTRODUODENOSCOPY (EGD) WITH PROPOFOL;  Surgeon: Carl Horner, MD;  Location: [facility] ENDOSCOPY;  Service: Endoscopy;  Laterality: N/A;  . JOINT REPLACEMENT Bilateral    hips  . TONSILLECTOMY      Current Medications: Outpatient Medications Prior to Visit  Medication Sig Dispense Refill  . aspirin 81 MG tablet Take 81 mg by mouth daily.    Marland Kitchen b complex vitamins tablet Take 1 tablet by mouth daily.    . Calcium-Magnesium-Vitamin D (CALCIUM MAGNESIUM PO) Take 1 tablet by mouth daily.    . Cholecalciferol (VITAMIN D3) 5000 units TABS Take 1 tablet by mouth every morning.     Marland Kitchen COPPER PO Take 1 mg by mouth daily.    Marland Kitchen levothyroxine (SYNTHROID, LEVOTHROID) 25 MCG tablet Take 25 mcg by mouth daily before breakfast.    . omega-3 acid ethyl esters (LOVAZA) 1 g capsule Take 1 g by mouth daily.    . tadalafil (CIALIS) 20 MG tablet Take 20 mg by mouth daily as needed  for erectile dysfunction.    . Zinc Sulfate (ZINC 15 PO) Take 1 tablet by mouth daily.    Marland Kitchen zolpidem (AMBIEN) 5 MG tablet Take 5 mg by mouth at bedtime as needed for sleep.    Marland Kitchen testosterone cypionate (DEPOTESTOTERONE CYPIONATE) 100 MG/ML injection Inject 100 mg into the muscle once a week. For IM use only; inject on Mondays     No facility-administered medications prior to visit.      Allergies:   Morphine and related and Lactose intolerance (gi)   Social History   Social History  . Marital status: Married    Spouse name: N/A  . Number of children: N/A  . Years of education: N/A   Social History Main Topics  . Smoking status: Never Smoker  . Smokeless tobacco: Never Used  . Alcohol use  3.0 oz/week    5 Glasses of wine per week  . Drug use: No  . Sexual activity: Not Asked   Other Topics Concern  . None   Social History Narrative  . None    He is originally from the [place] area.  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  He is remarried for 6 months.  His current wife is a Science writer at [employer] in [place].  His note tobacco history.  He drinks possibly 3-5 drinks per week.  He enjoys yoga, as well as swimming.  Family History:  [name] family history includes Edema in his mother; Heart disease in his father.  His mother is alive at 63.  His father died at age 37 and had heart issues and a pacemaker.  He has one brother who is alive and well age 45 and a sister who is alive and well at age 74.  ROS General: Negative; No fevers, chills, or night sweats;  HEENT: Negative; No changes in vision or hearing, sinus congestion, difficulty swallowing Pulmonary: Negative; No cough, wheezing, shortness of breath, hemoptysis Cardiovascular: see HPI GI: Negative; No nausea, vomiting, diarrhea, or abdominal pain GU: Negative; No dysuria, hematuria, or difficulty voiding Musculoskeletal: Status post bilateral hip replacements.  Recent lower back discomfort. Hematologic/Oncology: Negative; no easy bruising, bleeding Endocrine: Negative; no heat/cold intolerance; no diabetes Neuro: Negative; no changes in balance, headaches Skin: Negative; No rashes or skin lesions Psychiatric: Negative; No behavioral problems, depression Sleep: Negative; No snoring, daytime sleepiness, hypersomnolence, bruxism, restless legs, hypnogognic hallucinations, no cataplexy Other comprehensive 14 point system review is negative.   PHYSICAL EXAM:   VS:  BP 133/86 (BP Location: Left Arm, Cuff Size: Normal)   Pulse 90   Ht 5\' 10"  (1.778 m)   Wt 206 lb 6.4 oz (93.6 kg)   SpO2 94%   BMI 29.62 kg/m      Repeat blood pressure by me was 118/78.  Wt Readings from Last 3 Encounters:  10/20/16 206 lb 6.4 oz (93.6 kg)  07/22/16 205 lb (93 kg)  07/11/16 205 lb (93 kg)    General: Alert, oriented, no distress.  Skin: normal turgor, no rashes, warm and dry HEENT: Normocephalic, atraumatic. Pupils equal round and reactive to light; sclera anicteric; extraocular muscles intact; Fundi Normal without hemorrhages or exudates. Nose without nasal septal hypertrophy Mouth/Parynx benign; Mallinpatti scale 3 Neck: No JVD, no carotid bruits; normal carotid upstroke Lungs: clear to ausculatation and percussion; no wheezing or rales Chest wall: without tenderness to palpitation Heart: PMI not displaced, RRR, s1  s2 normal, faint 1/6 systolic murmur, no diastolic murmur, no rubs, gallops, thrills, or heaves Abdomen: soft, nontender; no hepatosplenomehaly, BS+; abdominal aorta nontender and not dilated by palpation. Back: no CVA tenderness Pulses 2+ Musculoskeletal: full range of motion, normal strength, no joint deformities Extremities: no clubbing cyanosis or edema, Homan's sign negative  Neurologic: grossly nonfocal; Cranial nerves grossly wnl Psychologic: Normal mood and affect   Studies/Labs Reviewed:   ECG (independently read by me): Normal sinus rhythm at 91 bpm.  PR interval 142.  QTc interval 432 ms.  No significant ST-T changes.  An Epworth Sleepiness scale score was calculated today and this endorsed at 9 arguing against significant daytime sleepiness.  Recent Labs: BMP Latest Ref Rng & Units 07/22/2016  Glucose 65 - 99 mg/dL 103(H)  BUN 6 - 20 mg/dL 19  Creatinine 0.61 - 1.24 mg/dL 1.31(H)  Sodium 135 - 145 mmol/L 137  Potassium 3.5 - 5.1 mmol/L 3.7  Chloride 101 - 111 mmol/L 105  CO2 22 - 32 mmol/L 25  Calcium 8.9 - 10.3 mg/dL 9.6     No flowsheet data found.  CBC Latest Ref Rng & Units 07/22/2016  WBC 4.0 - 10.5 K/uL 7.2  Hemoglobin 13.0 - 17.0 g/dL 14.9  Hematocrit 39.0 -  52.0 % 44.5  Platelets 150 - 400 K/uL 244   Lab Results  Component Value Date   MCV 87.6 07/22/2016   No results found for: TSH No results found for: HGBA1C   BNP No results found for: BNP  ProBNP No results found for: PROBNP   Lipid Panel  No results found for: CHOL, TRIG, HDL, CHOLHDL, VLDL, LDLCALC, LDLDIRECT   RADIOLOGY: No results found.   Additional studies/ records that were reviewed today include:  Office records from Dr. Anastasio Hogan    ASSESSMENT:    1. Pre-operative examination   2. Atypical chest pain   3. Hypothyroidism, unspecified type   4. Erectile dysfunction, unspecified erectile dysfunction type   5. 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  He had undergone endoscopy as well as colonoscopy and was found to have a possible anal mass.  He will be undergoing surgery with probable general anesthesia in late December 2017.  He denies any history of hypertension.  Recently, he has not been as active as he had in the past with reference to exercise due to back discomfort.  His father had heart disease but I do not know the specifics and ultimately had a pacemaker and died at age 43.  His chest pain had commenced several weeks after he had started taking testosterone injections for hypo-gonad is him.  He has not taken these since.  There is reported history of a hiatal hernia for which he has taken pantoprazole with benefit.  He was recently found to be mildly hypothyroid and was started on thyroid replacement therapy.  His chest pain has some atypical features.  He denies any clear-cut exertional precipitation.  However, with his need for upcoming surgery with probable general anesthesia, I have recommended that he undergo an exercise nuclear study for preoperative clearance.  I will try to obtain results of recent blood work done by his primary  physician.  These were presently not available for my review.  If his nuclear study is normal and not suggestive of significant  Ischemia he will be given clearance for his planned  surgery.  If the study demonstrates significant risk.  I will see him for follow-up evaluation and additional recommendations will be made at that time.   Medication Adjustments/Labs and Tests Ordered: Current medicines are reviewed at length with the patient today.  Concerns regarding medicines are outlined above.  Medication changes, Labs and Tests ordered today are listed in the Patient Instructions below. Patient Instructions  Your physician has requested that you have en exercise stress myoview. For further information please visit [email or web address]. Please follow instruction sheet, as given.   Your physician recommends that you schedule a follow-up appointment in: pending nuclear results.     Signed, Carl Majestic, MD  10/22/2016 6:26 PM    [facility] [address], [place], [place], [place]  [address] Phone: [phone]

## 2016-10-31 ENCOUNTER — Ambulatory Visit: Payer: Self-pay | Admitting: Surgery

## 2016-10-31 NOTE — H&P (Signed)
Carl Hogan  Location: [facility] Patient #: [id number] DOB: 09-08-1956 Married / Language: English / Race: White Male   History of Present Illness  The patient is a 60 year old male who presents with anal lesions. Note for "Anal lesions": Patient sent for surgical consultation by his gastroenterologist, Dr. Penelope Coop. Concern for anal canal mass. Possible need for removal.  Healthy active male. Rather physically active. Does not smoke. No prior surgeries. No prior anorectal issues. Underwent colonoscopy. A mass in the anal canal concerning. Pedunculated. Surgical consultation requested to consider removal of the suspicious mass. He patient usually has a bowel movement about three or four times a day. He walks about a half almost every day. No cardiopulmonary issues.  No personal nor family history of GI/colon cancer, inflammatory bowel disease, irritable bowel syndrome, allergy such as Celiac Sprue, dietary/dairy problems, colitis, ulcers nor gastritis. No recent sick contacts/gastroenteritis. No travel outside the country. No changes in diet. No dysphagia to solids or liquids. No significant heartburn or reflux. No hematochezia, hematemesis, coffee ground emesis. No evidence of prior gastric/peptic ulceration.  No changes.  Ready for surgery  Other Problems Elbert Ewings, CMA; 07/26/2016 1:31 PM) Arthritis Back Pain Chest pain Gastric Ulcer Hypercholesterolemia Thyroid Disease  Past Surgical History Elbert Ewings, CMA; 07/26/2016 1:31 PM) Hip Surgery Bilateral.  Diagnostic Studies History Elbert Ewings, CMA; 07/26/2016 1:31 PM) Colonoscopy within last year  Allergies Elbert Ewings, CMA; 07/26/2016 1:31 PM) Morphine Sulfate IR *ANALGESICS - OPIOID* Anaphylaxis.  Medication History Elbert Ewings, CMA; 07/26/2016 1:35 PM) Levothyroxine Sodium (50MCG Capsule, Oral) Active. Testosterone Cypionate (100MG /ML Solution, Intramuscular .5 ML once a week)  Active. Cialis (20MG  Tablet, Oral) Active. Ambien (5MG  Tablet, Oral) Active. B Complex (Oral) Active. Niacin (100MG  Tablet, Oral) Active. Folic Acid (Q000111Q Tablet, Oral) Active. Biotin (1000MCG Tablet, Oral) Active. Zinc (15MG  Capsule, Oral) Active. Copper (5MG  Capsule, Oral) Active. Calcium (500MG  Tablet, Oral) Active. Magnesium (250MG  Tablet, Oral) Active. Coconut Oil (1000MG  Capsule, Oral) Active. Aspirin (81MG  Tablet, Oral) Active. Medications Reconciled  Social History Elbert Ewings, [place]; 07/26/2016 1:31 PM) Alcohol use Moderate alcohol use. Caffeine use Carbonated beverages, Coffee, Tea. No drug use Tobacco use Never smoker.  Family History Elbert Ewings, [place]; 07/26/2016 1:31 PM) Arthritis Father, Mother, Sister. Depression Sister.    Review of Systems Elbert Ewings CMA; 07/26/2016 1:31 PM) General Present- Fatigue and Weight Gain. Not Present- Appetite Loss, Chills, Fever, Night Sweats and Weight Loss. Skin Present- Dryness. Not Present- Change in Wart/Mole, Hives, Jaundice, New Lesions, Non-Healing Wounds, Rash and Ulcer. HEENT Present- Hoarseness, Ringing in the Ears and Wears glasses/contact lenses. Not Present- Earache, Hearing Loss, Nose Bleed, Oral Ulcers, Seasonal Allergies, Sinus Pain, Sore Throat, Visual Disturbances and Yellow Eyes. Respiratory Present- Chronic Cough and Snoring. Not Present- Bloody sputum, Difficulty Breathing and Wheezing. Breast Not Present- Breast Mass, Breast Pain, Nipple Discharge and Skin Changes. Cardiovascular Present- Chest Pain, Shortness of Breath and Swelling of Extremities. Not Present- Difficulty Breathing Lying Down, Leg Cramps, Palpitations and Rapid Heart Rate. Gastrointestinal Present- Abdominal Pain, Bloating, Gets full quickly at meals and Nausea. Not Present- Bloody Stool, Change in Bowel Habits, Chronic diarrhea, Constipation, Difficulty Swallowing, Excessive gas, Hemorrhoids, Indigestion, Rectal Pain and  Vomiting. Male Genitourinary Not Present- Blood in Urine, Change in Urinary Stream, Frequency, Impotence, Nocturia, Painful Urination, Urgency and Urine Leakage. Musculoskeletal Present- Back Pain, Joint Pain, Joint Stiffness, Muscle Pain, Muscle Weakness and Swelling of Extremities. Neurological Present- Headaches, Tingling and Trouble walking. Not Present- Decreased Memory, Fainting, Numbness, Seizures, Tremor and  Weakness. Psychiatric Present- Anxiety. Not Present- Bipolar, Change in Sleep Pattern, Depression, Fearful and Frequent crying. Endocrine Present- Cold Intolerance. Not Present- Excessive Hunger, Hair Changes, Heat Intolerance, Hot flashes and New Diabetes. Hematology Not Present- Blood Thinners, Easy Bruising, Excessive bleeding, Gland problems, HIV and Persistent Infections.  Vitals Elbert Ewings CMA; 07/26/2016 1:36 PM) 07/26/2016 1:35 PM Weight: 205 lb Height: 70in Body Surface Area: 2.11 m Body Mass Index: 29.41 kg/m  Temp.: 98.28F(Temporal)  Pulse: 88 (Regular)  BP: 130/72 (Sitting, Left Arm, Standard)       Physical Exam Adin Hector MD; 07/26/2016 2:18 PM) General Mental Status-Alert. General Appearance-Not in acute distress, Not Sickly. Orientation-Oriented X3. Hydration-Well hydrated. Voice-Normal.  Integumentary Global Assessment Upon inspection and palpation of skin surfaces of the - Axillae: non-tender, no inflammation or ulceration, no drainage. and Distribution of scalp and body hair is normal. General Characteristics Temperature - normal warmth is noted.  Head and Neck Head-normocephalic, atraumatic with no lesions or palpable masses. Face Global Assessment - atraumatic, no absence of expression. Neck Global Assessment - no abnormal movements, no bruit auscultated on the right, no bruit auscultated on the left, no decreased range of motion, non-tender. Trachea-midline. Thyroid Gland Characteristics -  non-tender.  Eye Eyeball - Left-Extraocular movements intact, No Nystagmus. Eyeball - Right-Extraocular movements intact, No Nystagmus. Cornea - Left-No Hazy. Cornea - Right-No Hazy. Sclera/Conjunctiva - Left-No scleral icterus, No Discharge. Sclera/Conjunctiva - Right-No scleral icterus, No Discharge. Pupil - Left-Direct reaction to light normal. Pupil - Right-Direct reaction to light normal.  ENMT Ears Pinna - Left - no drainage observed, no generalized tenderness observed. Right - no drainage observed, no generalized tenderness observed. Nose and Sinuses External Inspection of the Nose - no destructive lesion observed. Inspection of the nares - Left - quiet respiration. Right - quiet respiration. Mouth and Throat Lips - Upper Lip - no fissures observed, no pallor noted. Lower Lip - no fissures observed, no pallor noted. Nasopharynx - no discharge present. Oral Cavity/Oropharynx - Tongue - no dryness observed. Oral Mucosa - no cyanosis observed. Hypopharynx - no evidence of airway distress observed.  Chest and Lung Exam Inspection Movements - Normal and Symmetrical. Accessory muscles - No use of accessory muscles in breathing. Palpation Palpation of the chest reveals - Non-tender. Auscultation Breath sounds - Normal and Clear.  Cardiovascular Auscultation Rhythm - Regular. Murmurs & Other Heart Sounds - Auscultation of the heart reveals - No Murmurs and No Systolic Clicks.  Abdomen Inspection Inspection of the abdomen reveals - No Visible peristalsis and No Abnormal pulsations. Umbilicus - No Bleeding, No Urine drainage. Palpation/Percussion Palpation and Percussion of the abdomen reveal - Soft, Non Tender, No Rebound tenderness, No Rigidity (guarding) and No Cutaneous hyperesthesia. Note: Abdomen soft. Nontender, nondistended. No guarding. Perhaps a 2 mm umbilical hernia at the base of the stalk barely noticeable on palpation. Otherwise, no other  hernias   Male Genitourinary Sexual Maturity Tanner 5 - Adult hair pattern and Adult penile size and shape. Note: No inguinal hernias. Normal external genitalia. Epididymi, testes, and spermatic cords normal without any masses.   Rectal Note: Right posterior pedunculated mass at the white line. Fibrous and changes to it. 1cm long.   Exam done with assistance of male Medical Assistant in the room. Perianal skin clean with good hygiene. No pruritis ani. No pilonidal disease. No fissure. No abscess/fistula. Small left lateral external hemorrhoid. Normal sphincter tone. Tolerates digital and anoscopic rectal exam. No other rectal masses.   Peripheral Vascular Upper Extremity  Inspection - Left - No Cyanotic nailbeds, Not Ischemic. Right - No Cyanotic nailbeds, Not Ischemic.  Neurologic Neurologic evaluation reveals -normal attention span and ability to concentrate, able to name objects and repeat phrases. Appropriate fund of knowledge , normal sensation and normal coordination. Mental Status Affect - not angry, not paranoid. Cranial Nerves-Normal Bilaterally. Gait-Normal.  Neuropsychiatric Mental status exam performed with findings of-able to articulate well with normal speech/language, rate, volume and coherence, thought content normal with ability to perform basic computations and apply abstract reasoning and no evidence of hallucinations, delusions, obsessions or homicidal/suicidal ideation.  Musculoskeletal Global Assessment Spine, Ribs and Pelvis - no instability, subluxation or laxity. Right Upper Extremity - no instability, subluxation or laxity.  Lymphatic Head & Neck  General Head & Neck Lymphatics: Bilateral - Description - No Localized lymphadenopathy. Axillary  General Axillary Region: Bilateral - Description - No Localized lymphadenopathy. Femoral & Inguinal  Generalized Femoral & Inguinal Lymphatics: Left - Description - No Localized lymphadenopathy.  Right - Description - No Localized lymphadenopathy.   Results Adin Hector MD; 07/27/2016 9:42 AM) Procedures  Name Value Date Hemorrhoids Procedure Other: Right posterior pedunculated mass at the white line. Fibrous and changes to it. 1cm long. Exam done with assistance of male Medical Assistant in the room. Perianal skin clean with good hygiene. No pruritis ani. No pilonidal disease. No fissure. No abscess/fistula. Small left lateral external hemorrhoid. Normal sphincter tone. Tolerates digital and anoscopic rectal exam. No other rectal masses.  Performed: 07/26/2016 2:18 PM    Assessment & Plan  ANAL POLYP (K62.0) Impression: Pedunculated mass at anorectal junction. Perhaps hypertrophic anal papillae vs hemorrhoid versus atypical adenoma, etc.  The safest course is to examination under anesthesia and removal given its moderate size and atypical features. He is interested in proceeding.  This would be an outpatient surgery under general. 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 I did caution he may not be fully recovered and week but we can certainly try and set it up the left possible. Has been a giant lesion but it sensitive area. We work to coordinate a convenient time. Most likely mid-October fall break  Addendum:  No changes, ready for surgery  Current Plans ANOSCOPY, DIAGNOSTIC [id number]) ENCOUNTER FOR PREOPERATIVE EXAMINATION FOR GENERAL SURGICAL PROCEDURE (Z01.818) Current Plans You are being scheduled for surgery - Our schedulers will call you.  You should hear from our office's scheduling department within 5 working days about the location, date, and time of surgery. We try to make accommodations for patient's preferences in scheduling surgery, but sometimes the OR schedule or the surgeon's schedule prevents [place] from making those accommodations.  If you have not heard from our office [phone]) in 5  working days, call the office and ask for your surgeon's nurse.  If you have other questions about your diagnosis, plan, or surgery, call the office and ask for your surgeon's nurse.  Pt Education - [facility] Rectal Prep for Anorectal outpatient/office surgery: discussed with patient and provided information. Pt Education - [facility] Rectal Surgery HCI (Welford Christmas): discussed with patient and provided information.   Adin Hector, M.D., F.A.C.S. Gastrointestinal and Minimally Invasive Surgery [facility] [address], [place], [place] [address] [phone] Main / Paging

## 2016-11-09 MED FILL — CIALIS 20 MG TABLET: 20 | 30 days supply | Qty: 6 | Fill #2

## 2016-11-21 ENCOUNTER — Telehealth (HOSPITAL_COMMUNITY): Payer: Self-pay

## 2016-11-21 NOTE — Telephone Encounter (Signed)
Encounter complete. 

## 2016-11-22 ENCOUNTER — Encounter (HOSPITAL_BASED_OUTPATIENT_CLINIC_OR_DEPARTMENT_OTHER): Payer: Self-pay | Admitting: *Deleted

## 2016-11-22 ENCOUNTER — Telehealth (HOSPITAL_COMMUNITY): Payer: Self-pay

## 2016-11-22 NOTE — Telephone Encounter (Signed)
Encounter complete. 

## 2016-11-23 ENCOUNTER — Ambulatory Visit (HOSPITAL_COMMUNITY)
Admission: RE | Admit: 2016-11-23 | Discharge: 2016-11-23 | Disposition: A | Discharge status: Home / Self Care | Payer: 59 | Source: Ambulatory Visit | Attending: Cardiology | Admitting: Cardiology

## 2016-11-23 ENCOUNTER — Encounter (HOSPITAL_BASED_OUTPATIENT_CLINIC_OR_DEPARTMENT_OTHER): Payer: Self-pay | Admitting: *Deleted

## 2016-11-23 DIAGNOSIS — Z01818 Encounter for other preprocedural examination: Secondary | ICD-10-CM | POA: Diagnosis not present

## 2016-11-23 LAB — MYOCARDIAL PERFUSION IMAGING
CHL CUP NUCLEAR SDS: 0
CHL CUP NUCLEAR SRS: 0
CHL CUP RESTING HR STRESS: 80 {beats}/min
CSEPED: 7 min
CSEPHR: 88 %
CSEPPHR: 142 {beats}/min
Estimated workload: 9.2 METS
Exercise duration (sec): 30 s
LV sys vol: 49 mL
LVDIAVOL: 105 mL (ref 62–150)
MPHR: 160 {beats}/min
NUC STRESS TID: 1.11
RPE: 17
SSS: 0

## 2016-11-23 MED ORDER — TECHNETIUM TC 99M TETROFOSMIN IV KIT
32.0000 | PACK | Freq: Once | INTRAVENOUS | Status: AC | PRN
  Administered 2016-11-23: 32 via INTRAVENOUS
  Filled 2016-11-23: qty 32

## 2016-11-23 MED ORDER — TECHNETIUM TC 99M TETROFOSMIN IV KIT
10.7000 | PACK | Freq: Once | INTRAVENOUS | Status: AC | PRN
  Administered 2016-11-23: 10.7 via INTRAVENOUS
  Filled 2016-11-23: qty 11

## 2016-11-23 NOTE — Progress Notes (Signed)
NPO AFTER MN.  ARRIVE AT 0700.  NEEDS ISTAT 8.  CURRENT EKG IN CHART AND EPIC.  WILL TAKE SYNTHROID AM DOS W/ SIPS OF WATER.  VERBALIZED UNDERSTANDING RECTAL PREP ORDERS:  START DRINKING LIQUIDS/ PUREE FOODS AT 1300 Tuesday 11-28-2016 AND TAKE 2 OZ. MILK OF MAGNESIA AFTER 2 HOURS NO RESULT TAKE ANOTHER 2OZ MILK OF MAGNESIA,  AND DO FLEET ENEMA AM DOS.  ALSO, WILL DO HIBICLENS SHOWER HS BEFORE AND AM DOS.

## 2016-11-24 ENCOUNTER — Telehealth: Payer: Self-pay | Admitting: Cardiovascular Disease

## 2016-11-24 ENCOUNTER — Telehealth: Payer: Self-pay | Admitting: *Deleted

## 2016-11-24 NOTE — Telephone Encounter (Signed)
Left message to return a call to discuss test results.

## 2016-11-24 NOTE — Telephone Encounter (Signed)
New message ° °Pt is returning call  ° °Please call back °

## 2016-11-24 NOTE — Telephone Encounter (Signed)
-----   Message from Troy Sine, MD sent at 11/23/2016  6:41 PM EST ----- Normal perfusion.  Mild ST changes with exercise.  EF 54% without wall motion.  Given normal perfusion, ? False positive ECG changes.  [facility] for surgery.  Clearance given.

## 2016-11-28 NOTE — Anesthesia Preprocedure Evaluation (Addendum)
Anesthesia Evaluation    Reviewed: Allergy & Precautions, Patient's Chart, lab work & pertinent test results  History of Anesthesia Complications Negative for: history of anesthetic complications  Airway Mallampati: II  TM Distance: >3 FB Neck ROM: Full    Dental no notable dental hx.    Pulmonary shortness of breath,    Pulmonary exam normal breath sounds clear to auscultation       Cardiovascular negative cardio ROS Normal cardiovascular exam Rhythm:Regular Rate:Normal     Neuro/Psych negative neurological ROS  negative psych ROS   GI/Hepatic Neg liver ROS, hiatal hernia,   Endo/Other  Hypothyroidism   Renal/GU negative Renal ROS     Musculoskeletal  (+) Arthritis ,   Abdominal   Peds  Hematology negative hematology ROS (+) anemia ,   Anesthesia Other Findings   Reproductive/Obstetrics negative OB ROS                            Anesthesia Physical  Anesthesia Plan  ASA: II  Anesthesia Plan: MAC   Post-op Pain Management:    Induction: Intravenous  Airway Management Planned: Natural Airway and Simple Face Mask  Additional Equipment:   Intra-op Plan:   Post-operative Plan:   Informed Consent:   Plan Discussed with:   Anesthesia Plan Comments:        Anesthesia Quick Evaluation

## 2016-11-29 ENCOUNTER — Ambulatory Visit (HOSPITAL_BASED_OUTPATIENT_CLINIC_OR_DEPARTMENT_OTHER): Payer: 59 | Admitting: Anesthesiology

## 2016-11-29 ENCOUNTER — Encounter (HOSPITAL_BASED_OUTPATIENT_CLINIC_OR_DEPARTMENT_OTHER)
Admission: RE | Disposition: A | Discharge status: Home / Self Care | Payer: Self-pay | Source: Ambulatory Visit | Attending: Surgery

## 2016-11-29 ENCOUNTER — Encounter (HOSPITAL_BASED_OUTPATIENT_CLINIC_OR_DEPARTMENT_OTHER): Payer: Self-pay

## 2016-11-29 ENCOUNTER — Ambulatory Visit (HOSPITAL_BASED_OUTPATIENT_CLINIC_OR_DEPARTMENT_OTHER)
Admission: RE | Admit: 2016-11-29 | Discharge: 2016-11-29 | Disposition: A | Discharge status: Home / Self Care | Payer: 59 | Source: Ambulatory Visit | Attending: Surgery | Admitting: Surgery

## 2016-11-29 DIAGNOSIS — Z7982 Long term (current) use of aspirin: Secondary | ICD-10-CM | POA: Diagnosis not present

## 2016-11-29 DIAGNOSIS — Z79899 Other long term (current) drug therapy: Secondary | ICD-10-CM | POA: Insufficient documentation

## 2016-11-29 DIAGNOSIS — E7800 Pure hypercholesterolemia, unspecified: Secondary | ICD-10-CM | POA: Insufficient documentation

## 2016-11-29 DIAGNOSIS — K620 Anal polyp: Secondary | ICD-10-CM | POA: Diagnosis not present

## 2016-11-29 DIAGNOSIS — K6289 Other specified diseases of anus and rectum: Secondary | ICD-10-CM | POA: Diagnosis not present

## 2016-11-29 DIAGNOSIS — K644 Residual hemorrhoidal skin tags: Secondary | ICD-10-CM | POA: Diagnosis not present

## 2016-11-29 DIAGNOSIS — K649 Unspecified hemorrhoids: Secondary | ICD-10-CM | POA: Diagnosis not present

## 2016-11-29 DIAGNOSIS — G4700 Insomnia, unspecified: Secondary | ICD-10-CM | POA: Diagnosis present

## 2016-11-29 DIAGNOSIS — D129 Benign neoplasm of anus and anal canal: Secondary | ICD-10-CM

## 2016-11-29 DIAGNOSIS — K629 Disease of anus and rectum, unspecified: Secondary | ICD-10-CM | POA: Diagnosis present

## 2016-11-29 DIAGNOSIS — E039 Hypothyroidism, unspecified: Secondary | ICD-10-CM | POA: Diagnosis not present

## 2016-11-29 HISTORY — DX: Male erectile dysfunction, unspecified: N52.9

## 2016-11-29 HISTORY — DX: Testicular hypofunction: E29.1

## 2016-11-29 HISTORY — DX: Insomnia, unspecified: G47.00

## 2016-11-29 HISTORY — PX: MASS EXCISION: SHX2000

## 2016-11-29 HISTORY — DX: Other specified diseases of anus and rectum: K62.89

## 2016-11-29 HISTORY — DX: Presence of spectacles and contact lenses: Z97.3

## 2016-11-29 HISTORY — DX: Other cervical disc degeneration, unspecified cervical region: M50.30

## 2016-11-29 HISTORY — DX: Hyperlipidemia, unspecified: E78.5

## 2016-11-29 HISTORY — DX: Diaphragmatic hernia without obstruction or gangrene: K44.9

## 2016-11-29 HISTORY — DX: Other cervical disc displacement, unspecified cervical region: M50.20

## 2016-11-29 HISTORY — DX: Hypothyroidism, unspecified: E03.9

## 2016-11-29 HISTORY — PX: EVALUATION UNDER ANESTHESIA WITH HEMORRHOIDECTOMY: SHX5624

## 2016-11-29 LAB — POCT I-STAT, CHEM 8
BUN: 31 mg/dL — AB (ref 6–20)
CHLORIDE: 106 mmol/L (ref 101–111)
CREATININE: 1 mg/dL (ref 0.61–1.24)
Calcium, Ion: 1.26 mmol/L (ref 1.15–1.40)
Glucose, Bld: 106 mg/dL — ABNORMAL HIGH (ref 65–99)
HEMATOCRIT: 45 % (ref 39.0–52.0)
Hemoglobin: 15.3 g/dL (ref 13.0–17.0)
POTASSIUM: 4.3 mmol/L (ref 3.5–5.1)
Sodium: 141 mmol/L (ref 135–145)
TCO2: 27 mmol/L (ref 0–100)

## 2016-11-29 SURGERY — EXAM UNDER ANESTHESIA WITH HEMORRHOIDECTOMY
Anesthesia: Monitor Anesthesia Care | Site: Anus

## 2016-11-29 MED ORDER — DIBUCAINE 1 % RE OINT
TOPICAL_OINTMENT | RECTAL | Status: DC | PRN
  Administered 2016-11-29: 1 via RECTAL

## 2016-11-29 MED ORDER — PROPOFOL 500 MG/50ML IV EMUL
INTRAVENOUS | Status: AC
  Filled 2016-11-29: qty 50

## 2016-11-29 MED ORDER — OXYCODONE HCL 5 MG PO TABS
5.0000 mg | ORAL_TABLET | ORAL | Status: DC | PRN
  Filled 2016-11-29: qty 2

## 2016-11-29 MED ORDER — CELECOXIB 200 MG PO CAPS
ORAL_CAPSULE | ORAL | Status: AC
Start: 2016-11-29 — End: 2016-11-29
  Filled 2016-11-29: qty 2

## 2016-11-29 MED ORDER — NAPROXEN 500 MG PO TABS: 500.0000 mg | ORAL_TABLET | Freq: Two times a day (BID) | ORAL | 1 refills | Status: DC | PRN

## 2016-11-29 MED ORDER — LACTATED RINGERS IV SOLN
INTRAVENOUS | Status: DC
  Administered 2016-11-29: 08:00:00 via INTRAVENOUS
  Filled 2016-11-29: qty 1000

## 2016-11-29 MED ORDER — ACETAMINOPHEN 500 MG PO TABS
1000.0000 mg | ORAL_TABLET | ORAL | Status: DC
  Filled 2016-11-29: qty 2

## 2016-11-29 MED ORDER — METOCLOPRAMIDE HCL 5 MG/ML IJ SOLN
10.0000 mg | Freq: Once | INTRAMUSCULAR | Status: DC | PRN
  Filled 2016-11-29: qty 2

## 2016-11-29 MED ORDER — GABAPENTIN 300 MG PO CAPS
300.0000 mg | ORAL_CAPSULE | ORAL | Status: AC
Start: 2016-11-29 — End: 2016-11-29
  Administered 2016-11-29: 300 mg via ORAL
  Filled 2016-11-29: qty 1

## 2016-11-29 MED ORDER — ACETAMINOPHEN 325 MG PO TABS
650.0000 mg | ORAL_TABLET | ORAL | Status: DC | PRN
  Filled 2016-11-29: qty 2

## 2016-11-29 MED ORDER — LACTATED RINGERS IV SOLN
INTRAVENOUS | Status: DC
  Filled 2016-11-29: qty 1000

## 2016-11-29 MED ORDER — CLINDAMYCIN PHOSPHATE 900 MG/50ML IV SOLN
900.0000 mg | INTRAVENOUS | Status: AC
  Administered 2016-11-29: 900 mg via INTRAVENOUS
  Filled 2016-11-29: qty 50

## 2016-11-29 MED ORDER — MIDAZOLAM HCL 2 MG/2ML IJ SOLN
INTRAMUSCULAR | Status: AC
  Filled 2016-11-29: qty 4

## 2016-11-29 MED ORDER — SODIUM CHLORIDE 0.9% FLUSH
3.0000 mL | Freq: Two times a day (BID) | INTRAVENOUS | Status: DC
  Filled 2016-11-29: qty 3

## 2016-11-29 MED ORDER — GABAPENTIN 300 MG PO CAPS
ORAL_CAPSULE | ORAL | Status: AC
  Filled 2016-11-29: qty 1

## 2016-11-29 MED ORDER — MEPERIDINE HCL 25 MG/ML IJ SOLN
6.2500 mg | INTRAMUSCULAR | Status: DC | PRN
  Filled 2016-11-29: qty 1

## 2016-11-29 MED ORDER — PROPOFOL 500 MG/50ML IV EMUL
INTRAVENOUS | Status: DC | PRN
  Administered 2016-11-29: 25 ug/kg/min via INTRAVENOUS

## 2016-11-29 MED ORDER — OXYCODONE HCL 5 MG PO TABS: 5.0000 mg | ORAL_TABLET | Freq: Four times a day (QID) | ORAL | 0 refills | Status: DC | PRN

## 2016-11-29 MED ORDER — BUPIVACAINE LIPOSOME 1.3 % IJ SUSP
20.0000 mL | INTRAMUSCULAR | Status: DC
  Filled 2016-11-29: qty 20

## 2016-11-29 MED ORDER — CELECOXIB 400 MG PO CAPS
400.0000 mg | ORAL_CAPSULE | ORAL | Status: AC
  Administered 2016-11-29: 400 mg via ORAL
  Filled 2016-11-29: qty 1

## 2016-11-29 MED ORDER — FENTANYL CITRATE (PF) 100 MCG/2ML IJ SOLN
25.0000 ug | INTRAMUSCULAR | Status: DC | PRN
  Filled 2016-11-29: qty 1

## 2016-11-29 MED ORDER — CLINDAMYCIN PHOSPHATE 900 MG/50ML IV SOLN
INTRAVENOUS | Status: AC
  Filled 2016-11-29: qty 50

## 2016-11-29 MED ORDER — GENTAMICIN SULFATE 40 MG/ML IJ SOLN
5.0000 mg/kg | INTRAVENOUS | Status: AC
  Administered 2016-11-29: 400 mg via INTRAVENOUS
  Filled 2016-11-29: qty 10

## 2016-11-29 MED ORDER — BUPIVACAINE LIPOSOME 1.3 % IJ SUSP
INTRAMUSCULAR | Status: DC | PRN
  Administered 2016-11-29: 20 mL

## 2016-11-29 MED ORDER — SODIUM CHLORIDE 0.9% FLUSH
3.0000 mL | INTRAVENOUS | Status: DC | PRN
  Filled 2016-11-29: qty 3

## 2016-11-29 MED ORDER — CHLORHEXIDINE GLUCONATE CLOTH 2 % EX PADS
6.0000 | MEDICATED_PAD | Freq: Once | CUTANEOUS | Status: DC
  Filled 2016-11-29: qty 6

## 2016-11-29 MED ORDER — BUPIVACAINE-EPINEPHRINE 0.25% -1:200000 IJ SOLN
INTRAMUSCULAR | Status: DC | PRN
  Administered 2016-11-29: 30 mL

## 2016-11-29 MED ORDER — FENTANYL CITRATE (PF) 100 MCG/2ML IJ SOLN
INTRAMUSCULAR | Status: AC
  Filled 2016-11-29: qty 2

## 2016-11-29 MED ORDER — FENTANYL CITRATE (PF) 100 MCG/2ML IJ SOLN
INTRAMUSCULAR | Status: DC | PRN
  Administered 2016-11-29: 100 ug via INTRAVENOUS

## 2016-11-29 MED ORDER — DEXTROSE 5 % IV SOLN
5.0000 mg/kg | INTRAVENOUS | Status: DC
  Filled 2016-11-29: qty 11.75

## 2016-11-29 MED ORDER — LIDOCAINE 2% (20 MG/ML) 5 ML SYRINGE
INTRAMUSCULAR | Status: AC
Start: 2016-11-29 — End: 2016-11-29
  Filled 2016-11-29: qty 5

## 2016-11-29 MED ORDER — LIDOCAINE 2% (20 MG/ML) 5 ML SYRINGE
INTRAMUSCULAR | Status: DC | PRN
  Administered 2016-11-29: 100 mg via INTRAVENOUS

## 2016-11-29 MED ORDER — MIDAZOLAM HCL 5 MG/5ML IJ SOLN
INTRAMUSCULAR | Status: DC | PRN
  Administered 2016-11-29 (×2): 2 mg via INTRAVENOUS

## 2016-11-29 MED ORDER — SODIUM CHLORIDE 0.9 % IV SOLN
250.0000 mL | INTRAVENOUS | Status: DC | PRN
  Filled 2016-11-29: qty 250

## 2016-11-29 MED ORDER — ACETAMINOPHEN 650 MG RE SUPP
650.0000 mg | RECTAL | Status: DC | PRN
  Filled 2016-11-29: qty 1

## 2016-11-29 MED FILL — LEVOTHYROXINE 25 MCG TABLET: 25 | 30 days supply | Qty: 30 | Fill #1

## 2016-11-29 MED FILL — NAPROXEN 500 MG TABLET: 500 | 15 days supply | Qty: 30 | Fill #0

## 2016-11-29 MED FILL — oxyCODONE HCL 5 MG TABS: 5 | 3 days supply | Qty: 30 | Fill #0

## 2016-11-29 SURGICAL SUPPLY — 55 items
BENZOIN TINCTURE PRP APPL 2/3 (GAUZE/BANDAGES/DRESSINGS) ×2 IMPLANT
BLADE HEX COATED 2.75 (ELECTRODE) ×2 IMPLANT
BLADE SURG 10 STRL SS (BLADE) ×2 IMPLANT
BLADE SURG 15 STRL LF DISP TIS (BLADE) ×1 IMPLANT
BLADE SURG 15 STRL SS (BLADE) ×1
BRIEF STRETCH FOR OB PAD LRG (UNDERPADS AND DIAPERS) ×2 IMPLANT
CANISTER SUCTION 1200CC (MISCELLANEOUS) ×2 IMPLANT
COVER BACK TABLE 60X90IN (DRAPES) ×2 IMPLANT
COVER MAYO STAND STRL (DRAPES) ×2 IMPLANT
DECANTER SPIKE VIAL GLASS SM (MISCELLANEOUS) ×2 IMPLANT
DRAPE LAPAROTOMY 100X72 PEDS (DRAPES) ×2 IMPLANT
DRAPE LG THREE QUARTER DISP (DRAPES) IMPLANT
DRSG PAD ABDOMINAL 8X10 ST (GAUZE/BANDAGES/DRESSINGS) ×2 IMPLANT
ELECT NEEDLE TIP 2.8 STRL (NEEDLE) IMPLANT
ELECT REM PT RETURN 9FT ADLT (ELECTROSURGICAL) ×2
ELECTRODE REM PT RTRN 9FT ADLT (ELECTROSURGICAL) ×1 IMPLANT
GLOVE BIOGEL PI IND STRL 7.5 (GLOVE) ×3 IMPLANT
GLOVE BIOGEL PI IND STRL 8 (GLOVE) ×1 IMPLANT
GLOVE BIOGEL PI INDICATOR 7.5 (GLOVE) ×3
GLOVE BIOGEL PI INDICATOR 8 (GLOVE) ×1
GLOVE ECLIPSE 8.0 STRL XLNG CF (GLOVE) ×2 IMPLANT
GOWN STRL REUS W/ TWL LRG LVL3 (GOWN DISPOSABLE) IMPLANT
GOWN STRL REUS W/ TWL XL LVL3 (GOWN DISPOSABLE) ×1 IMPLANT
GOWN STRL REUS W/TWL LRG LVL3 (GOWN DISPOSABLE) ×2 IMPLANT
GOWN STRL REUS W/TWL XL LVL3 (GOWN DISPOSABLE) ×3 IMPLANT
KIT ROOM TURNOVER WOR (KITS) ×2 IMPLANT
LEGGING LITHOTOMY PAIR STRL (DRAPES) IMPLANT
NEEDLE HYPO 22GX1.5 SAFETY (NEEDLE) ×2 IMPLANT
NS IRRIG 500ML POUR BTL (IV SOLUTION) ×2 IMPLANT
PACK BASIN DAY SURGERY FS (CUSTOM PROCEDURE TRAY) ×2 IMPLANT
PAD ABD 8X10 STRL (GAUZE/BANDAGES/DRESSINGS) ×2 IMPLANT
PENCIL BUTTON HOLSTER BLD 10FT (ELECTRODE) ×2 IMPLANT
SCRUB TECHNI CARE 4 OZ NO DYE (MISCELLANEOUS) ×2 IMPLANT
SHEARS HARMONIC 9CM CVD (BLADE) IMPLANT
SPONGE GAUZE 4X4 12PLY STER LF (GAUZE/BANDAGES/DRESSINGS) ×2 IMPLANT
SPONGE SURGIFOAM ABS GEL 100 (HEMOSTASIS) IMPLANT
SPONGE SURGIFOAM ABS GEL 12-7 (HEMOSTASIS) IMPLANT
SURGILUBE 2OZ TUBE FLIPTOP (MISCELLANEOUS) ×2 IMPLANT
SUT CHROMIC 2 0 SH (SUTURE) IMPLANT
SUT CHROMIC 3 0 SH 27 (SUTURE) ×2 IMPLANT
SUT VIC AB 2-0 SH 27 (SUTURE) ×1
SUT VIC AB 2-0 SH 27XBRD (SUTURE) ×1 IMPLANT
SUT VIC AB 2-0 UR6 27 (SUTURE) IMPLANT
SUT VICRYL 0 UR6 27IN ABS (SUTURE) IMPLANT
SUT VICRYL AB 2 0 TIE (SUTURE) IMPLANT
SUT VICRYL AB 2 0 TIES (SUTURE)
SYR 20CC LL (SYRINGE) ×2 IMPLANT
SYR BULB IRRIGATION 50ML (SYRINGE) ×2 IMPLANT
SYR CONTROL 10ML LL (SYRINGE) IMPLANT
TAPE CLOTH 3X10 TAN LF (GAUZE/BANDAGES/DRESSINGS) ×2 IMPLANT
TOWEL OR 17X24 6PK STRL BLUE (TOWEL DISPOSABLE) ×4 IMPLANT
TRAY DSU PREP LF (CUSTOM PROCEDURE TRAY) ×2 IMPLANT
TUBE CONNECTING 12X1/4 (SUCTIONS) ×2 IMPLANT
UNDERPAD 30X30 INCONTINENT (UNDERPADS AND DIAPERS) ×2 IMPLANT
YANKAUER SUCT BULB TIP NO VENT (SUCTIONS) ×2 IMPLANT

## 2016-11-29 NOTE — H&P (View-Only) (Signed)
Carl Hogan  Location: [facility] Patient #: [id number] DOB: 03/22/1956 Married / Language: English / Race: White Male   History of Present Illness  The patient is a 60 year old male who presents with anal lesions. Note for "Anal lesions": Patient sent for surgical consultation by his gastroenterologist, Dr. Penelope Coop. Concern for anal canal mass. Possible need for removal.  Healthy active male. Rather physically active. Does not smoke. No prior surgeries. No prior anorectal issues. Underwent colonoscopy. A mass in the anal canal concerning. Pedunculated. Surgical consultation requested to consider removal of the suspicious mass. He patient usually has a bowel movement about three or four times a day. He walks about a half almost every day. No cardiopulmonary issues.  No personal nor family history of GI/colon cancer, inflammatory bowel disease, irritable bowel syndrome, allergy such as Celiac Sprue, dietary/dairy problems, colitis, ulcers nor gastritis. No recent sick contacts/gastroenteritis. No travel outside the country. No changes in diet. No dysphagia to solids or liquids. No significant heartburn or reflux. No hematochezia, hematemesis, coffee ground emesis. No evidence of prior gastric/peptic ulceration.  No changes.  Ready for surgery  Other Problems Carl Hogan, CMA; 07/26/2016 1:31 PM) Arthritis Back Pain Chest pain Gastric Ulcer Hypercholesterolemia Thyroid Disease  Past Surgical History Carl Hogan, CMA; 07/26/2016 1:31 PM) Hip Surgery Bilateral.  Diagnostic Studies History Carl Hogan, CMA; 07/26/2016 1:31 PM) Colonoscopy within last year  Allergies Carl Hogan, CMA; 07/26/2016 1:31 PM) Morphine Sulfate IR *ANALGESICS - OPIOID* Anaphylaxis.  Medication History Carl Hogan, CMA; 07/26/2016 1:35 PM) Levothyroxine Sodium (50MCG Capsule, Oral) Active. Testosterone Cypionate (100MG /ML Solution, Intramuscular .5 ML once a week)  Active. Cialis (20MG  Tablet, Oral) Active. Ambien (5MG  Tablet, Oral) Active. B Complex (Oral) Active. Niacin (100MG  Tablet, Oral) Active. Folic Acid (Q000111Q Tablet, Oral) Active. Biotin (1000MCG Tablet, Oral) Active. Zinc (15MG  Capsule, Oral) Active. Copper (5MG  Capsule, Oral) Active. Calcium (500MG  Tablet, Oral) Active. Magnesium (250MG  Tablet, Oral) Active. Coconut Oil (1000MG  Capsule, Oral) Active. Aspirin (81MG  Tablet, Oral) Active. Medications Reconciled  Social History Carl Hogan, [place]; 07/26/2016 1:31 PM) Alcohol use Moderate alcohol use. Caffeine use Carbonated beverages, Coffee, Tea. No drug use Tobacco use Never smoker.  Family History Carl Hogan, [place]; 07/26/2016 1:31 PM) Arthritis Father, Mother, Sister. Depression Sister.    Review of Systems Carl Hogan CMA; 07/26/2016 1:31 PM) General Present- Fatigue and Weight Gain. Not Present- Appetite Loss, Chills, Fever, Night Sweats and Weight Loss. Skin Present- Dryness. Not Present- Change in Wart/Mole, Hives, Jaundice, New Lesions, Non-Healing Wounds, Rash and Ulcer. HEENT Present- Hoarseness, Ringing in the Ears and Wears glasses/contact lenses. Not Present- Earache, Hearing Loss, Nose Bleed, Oral Ulcers, Seasonal Allergies, Sinus Pain, Sore Throat, Visual Disturbances and Yellow Eyes. Respiratory Present- Chronic Cough and Snoring. Not Present- Bloody sputum, Difficulty Breathing and Wheezing. Breast Not Present- Breast Mass, Breast Pain, Nipple Discharge and Skin Changes. Cardiovascular Present- Chest Pain, Shortness of Breath and Swelling of Extremities. Not Present- Difficulty Breathing Lying Down, Leg Cramps, Palpitations and Rapid Heart Rate. Gastrointestinal Present- Abdominal Pain, Bloating, Gets full quickly at meals and Nausea. Not Present- Bloody Stool, Change in Bowel Habits, Chronic diarrhea, Constipation, Difficulty Swallowing, Excessive gas, Hemorrhoids, Indigestion, Rectal Pain and  Vomiting. Male Genitourinary Not Present- Blood in Urine, Change in Urinary Stream, Frequency, Impotence, Nocturia, Painful Urination, Urgency and Urine Leakage. Musculoskeletal Present- Back Pain, Joint Pain, Joint Stiffness, Muscle Pain, Muscle Weakness and Swelling of Extremities. Neurological Present- Headaches, Tingling and Trouble walking. Not Present- Decreased Memory, Fainting, Numbness, Seizures, Tremor and  Weakness. Psychiatric Present- Anxiety. Not Present- Bipolar, Change in Sleep Pattern, Depression, Fearful and Frequent crying. Endocrine Present- Cold Intolerance. Not Present- Excessive Hunger, Hair Changes, Heat Intolerance, Hot flashes and New Diabetes. Hematology Not Present- Blood Thinners, Easy Bruising, Excessive bleeding, Gland problems, HIV and Persistent Infections.  Vitals Carl Hogan CMA; 07/26/2016 1:36 PM) 07/26/2016 1:35 PM Weight: 205 lb Height: 70in Body Surface Area: 2.11 m Body Mass Index: 29.41 kg/m  Temp.: 98.29F(Temporal)  Pulse: 88 (Regular)  BP: 130/72 (Sitting, Left Arm, Standard)       Physical Exam Adin Hector MD; 07/26/2016 2:18 PM) General Mental Status-Alert. General Appearance-Not in acute distress, Not Sickly. Orientation-Oriented X3. Hydration-Well hydrated. Voice-Normal.  Integumentary Global Assessment Upon inspection and palpation of skin surfaces of the - Axillae: non-tender, no inflammation or ulceration, no drainage. and Distribution of scalp and body hair is normal. General Characteristics Temperature - normal warmth is noted.  Head and Neck Head-normocephalic, atraumatic with no lesions or palpable masses. Face Global Assessment - atraumatic, no absence of expression. Neck Global Assessment - no abnormal movements, no bruit auscultated on the right, no bruit auscultated on the left, no decreased range of motion, non-tender. Trachea-midline. Thyroid Gland Characteristics -  non-tender.  Eye Eyeball - Left-Extraocular movements intact, No Nystagmus. Eyeball - Right-Extraocular movements intact, No Nystagmus. Cornea - Left-No Hazy. Cornea - Right-No Hazy. Sclera/Conjunctiva - Left-No scleral icterus, No Discharge. Sclera/Conjunctiva - Right-No scleral icterus, No Discharge. Pupil - Left-Direct reaction to light normal. Pupil - Right-Direct reaction to light normal.  ENMT Ears Pinna - Left - no drainage observed, no generalized tenderness observed. Right - no drainage observed, no generalized tenderness observed. Nose and Sinuses External Inspection of the Nose - no destructive lesion observed. Inspection of the nares - Left - quiet respiration. Right - quiet respiration. Mouth and Throat Lips - Upper Lip - no fissures observed, no pallor noted. Lower Lip - no fissures observed, no pallor noted. Nasopharynx - no discharge present. Oral Cavity/Oropharynx - Tongue - no dryness observed. Oral Mucosa - no cyanosis observed. Hypopharynx - no evidence of airway distress observed.  Chest and Lung Exam Inspection Movements - Normal and Symmetrical. Accessory muscles - No use of accessory muscles in breathing. Palpation Palpation of the chest reveals - Non-tender. Auscultation Breath sounds - Normal and Clear.  Cardiovascular Auscultation Rhythm - Regular. Murmurs & Other Heart Sounds - Auscultation of the heart reveals - No Murmurs and No Systolic Clicks.  Abdomen Inspection Inspection of the abdomen reveals - No Visible peristalsis and No Abnormal pulsations. Umbilicus - No Bleeding, No Urine drainage. Palpation/Percussion Palpation and Percussion of the abdomen reveal - Soft, Non Tender, No Rebound tenderness, No Rigidity (guarding) and No Cutaneous hyperesthesia. Note: Abdomen soft. Nontender, nondistended. No guarding. Perhaps a 2 mm umbilical hernia at the base of the stalk barely noticeable on palpation. Otherwise, no other  hernias   Male Genitourinary Sexual Maturity Tanner 5 - Adult hair pattern and Adult penile size and shape. Note: No inguinal hernias. Normal external genitalia. Epididymi, testes, and spermatic cords normal without any masses.   Rectal Note: Right posterior pedunculated mass at the white line. Fibrous and changes to it. 1cm long.   Exam done with assistance of male Medical Assistant in the room. Perianal skin clean with good hygiene. No pruritis ani. No pilonidal disease. No fissure. No abscess/fistula. Small left lateral external hemorrhoid. Normal sphincter tone. Tolerates digital and anoscopic rectal exam. No other rectal masses.   Peripheral Vascular Upper Extremity  Inspection - Left - No Cyanotic nailbeds, Not Ischemic. Right - No Cyanotic nailbeds, Not Ischemic.  Neurologic Neurologic evaluation reveals -normal attention span and ability to concentrate, able to name objects and repeat phrases. Appropriate fund of knowledge , normal sensation and normal coordination. Mental Status Affect - not angry, not paranoid. Cranial Nerves-Normal Bilaterally. Gait-Normal.  Neuropsychiatric Mental status exam performed with findings of-able to articulate well with normal speech/language, rate, volume and coherence, thought content normal with ability to perform basic computations and apply abstract reasoning and no evidence of hallucinations, delusions, obsessions or homicidal/suicidal ideation.  Musculoskeletal Global Assessment Spine, Ribs and Pelvis - no instability, subluxation or laxity. Right Upper Extremity - no instability, subluxation or laxity.  Lymphatic Head & Neck  General Head & Neck Lymphatics: Bilateral - Description - No Localized lymphadenopathy. Axillary  General Axillary Region: Bilateral - Description - No Localized lymphadenopathy. Femoral & Inguinal  Generalized Femoral & Inguinal Lymphatics: Left - Description - No Localized lymphadenopathy.  Right - Description - No Localized lymphadenopathy.   Results Adin Hector MD; 07/27/2016 9:42 AM) Procedures  Name Value Date Hemorrhoids Procedure Other: Right posterior pedunculated mass at the white line. Fibrous and changes to it. 1cm long. Exam done with assistance of male Medical Assistant in the room. Perianal skin clean with good hygiene. No pruritis ani. No pilonidal disease. No fissure. No abscess/fistula. Small left lateral external hemorrhoid. Normal sphincter tone. Tolerates digital and anoscopic rectal exam. No other rectal masses.  Performed: 07/26/2016 2:18 PM    Assessment & Plan  ANAL POLYP (K62.0) Impression: Pedunculated mass at anorectal junction. Perhaps hypertrophic anal papillae vs hemorrhoid versus atypical adenoma, etc.  The safest course is to examination under anesthesia and removal given its moderate size and atypical features. He is interested in proceeding.  This would be an outpatient surgery under general. 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 I did caution he may not be fully recovered and week but we can certainly try and set it up the left possible. Has been a giant lesion but it sensitive area. We work to coordinate a convenient time. Most likely mid-October fall break  Addendum:  No changes, ready for surgery  Current Plans ANOSCOPY, DIAGNOSTIC [id number]) ENCOUNTER FOR PREOPERATIVE EXAMINATION FOR GENERAL SURGICAL PROCEDURE (Z01.818) Current Plans You are being scheduled for surgery - Our schedulers will call you.  You should hear from our office's scheduling department within 5 working days about the location, date, and time of surgery. We try to make accommodations for patient's preferences in scheduling surgery, but sometimes the OR schedule or the surgeon's schedule prevents [place] from making those accommodations.  If you have not heard from our office [phone]) in 5  working days, call the office and ask for your surgeon's nurse.  If you have other questions about your diagnosis, plan, or surgery, call the office and ask for your surgeon's nurse.  Pt Education - [facility] Rectal Prep for Anorectal outpatient/office surgery: discussed with patient and provided information. Pt Education - [facility] Rectal Surgery HCI (Shakeem Stern): discussed with patient and provided information.   Adin Hector, M.D., F.A.C.S. Gastrointestinal and Minimally Invasive Surgery [facility] [address] [place], [place] [address] [phone] Main / Paging

## 2016-11-29 NOTE — Interval H&P Note (Signed)
History and Physical Interval Note:  11/29/2016 8:21 AM  Carl Hogan  has presented today for surgery, with the diagnosis of ANAL CANAL MASS  The various methods of treatment have been discussed with the patient and family. After consideration of risks, benefits and other options for treatment, the patient has consented to  Procedure(s): EXAM UNDER ANESTHESIA WITH POSSIBLE HEMORRHOIDECTOMY (N/A) EXCISION ANAL CANAL MASS (N/A) as a surgical intervention .  [name] history has been reviewed, patient examined, no change in status, stable for surgery.  I have reviewed the patient's chart and labs.  Questions were answered to the patient's satisfaction.     Neel Buffone C.

## 2016-11-29 NOTE — Anesthesia Procedure Notes (Signed)
Procedure Name: MAC Date/Time: 11/29/2016 8:44 AM Performed by: Bethena Roys T Pre-anesthesia Checklist: Timeout performed, Patient identified, Emergency Drugs available, Suction available and Patient being monitored Patient Re-evaluated:Patient Re-evaluated prior to inductionOxygen Delivery Method: Nasal cannula Placement Confirmation: positive ETCO2

## 2016-11-29 NOTE — Discharge Instructions (Signed)
Information for Discharge Teaching: EXPAREL (bupivacaine liposome injectable suspension)   Your surgeon gave you EXPAREL(bupivacaine) in your surgical incision to help control your pain after surgery.   EXPAREL is a local anesthetic that provides pain relief by numbing the tissue around the surgical site.  EXPAREL is designed to release pain medication over time and can control pain for up to 72 hours.  Depending on how you respond to EXPAREL, you may require less pain medication during your recovery.  Possible side effects:  Temporary loss of sensation or ability to move in the area where bupivacaine was injected.  Nausea, vomiting, constipation  Rarely, numbness and tingling in your mouth or lips, lightheadedness, or anxiety may occur.  Call your doctor right away if you think you may be experiencing any of these sensations, or if you have other questions regarding possible side effects.  Follow all other discharge instructions given to you by your surgeon or nurse. Eat a healthy diet and drink plenty of water or other fluids.  If you return to the hospital for any reason within 96 hours following the administration of EXPAREL, please inform your health care providers. Post Anesthesia Home Care Instructions  Activity: Get plenty of rest for the remainder of the day. A responsible adult should stay with you for 24 hours following the procedure.  For the next 24 hours, DO NOT: -Drive a car -Paediatric nurse -Drink alcoholic beverages -Take any medication unless instructed by your physician -Make any legal decisions or sign important papers.  Meals: Start with liquid foods such as gelatin or soup. Progress to regular foods as tolerated. Avoid greasy, spicy, heavy foods. If nausea and/or vomiting occur, drink only clear liquids until the nausea and/or vomiting subsides. Call your physician if vomiting continues.  Special Instructions/Symptoms: Your throat may feel dry or sore  from the anesthesia or the breathing tube placed in your throat during surgery. If this causes discomfort, gargle with warm salt water. The discomfort should disappear within 24 hours.  If you had a scopolamine patch placed behind your ear for the management of post- operative nausea and/or vomiting:  1. The medication in the patch is effective for 72 hours, after which it should be removed.  Wrap patch in a tissue and discard in the trash. Wash hands thoroughly with soap and water. 2. You may remove the patch earlier than 72 hours if you experience unpleasant side effects which may include dry mouth, dizziness or visual disturbances. 3. Avoid touching the patch. Wash your hands with soap and water after contact with the patch.   ANORECTAL SURGERY:  POST OPERATIVE INSTRUCTIONS  ######################################################################  EAT Gradually transition to a high fiber diet with a fiber supplement over the next few weeks after discharge.  Start with a pureed / full liquid diet (see below)  WALK Walk an hour a day.  Control your pain to do that.    CONTROL PAIN Control pain so that you can walk, sleep, tolerate sneezing/coughing, go up/down stairs.  HAVE A BOWEL MOVEMENT DAILY Keep your bowels regular to avoid problems.  OK to try a laxative to override constipation.  OK to use an antidairrheal to slow down diarrhea.  Call if not better after 2 tries  CALL IF YOU HAVE PROBLEMS/CONCERNS Call if you are still struggling despite following these instructions. Call if you have concerns not answered by these instructions  ######################################################################    1. Take your usually prescribed home medications unless otherwise directed. 2. DIET: Follow a light  bland diet the first 24 hours after arrival home, such as soup, liquids, crackers, etc.  Be sure to include lots of fluids daily.  Avoid fast food or heavy meals as your are more  likely to get nauseated.  Eat a low fat the next few days after surgery.   3. PAIN CONTROL: a. Pain is best controlled by a usual combination of three different methods TOGETHER: i. Ice/Heat ii. Over the counter pain medication iii. Prescription pain medication b. Most patients will experience some swelling and discomfort in the anus/rectal area. and incisions.  Ice packs or heat (30-60 minutes up to 6 times a day) will help. Use ice for the first few days to help decrease swelling and bruising, then switch to heat such as warm towels, sitz baths, warm baths, etc to help relax tight/sore spots and speed recovery.  Some people prefer to use ice alone, heat alone, alternating between ice & heat.  Experiment to what works for you.  Swelling and bruising can take several weeks to resolve.   c. It is helpful to take an over-the-counter pain medication regularly for the first few weeks.  Choose one of the following that works best for you: i. Naproxen (Aleve, etc)  Two 220mg  tabs twice a day ii. Ibuprofen (Advil, etc) Three 200mg  tabs four times a day (every meal & bedtime) iii. Acetaminophen (Tylenol, etc) 500-650mg  four times a day (every meal & bedtime) d. A  prescription for pain medication (such as oxycodone, hydrocodone, etc) should be given to you upon discharge.  Take your pain medication as prescribed.  i. If you are having problems/concerns with the prescription medicine (does not control pain, nausea, vomiting, rash, itching, etc), please call us [phone] to see if we need to switch you to a different pain medicine that will work better for you and/or control your side effect better. ii. If you need a refill on your pain medication, please contact your pharmacy.  They will contact our office to request authorization. Prescriptions will not be filled after 5 pm or on week-ends.  Use a Sitz Bath 4-8 times a day for relief   [employer] A sitz bath is a warm water bath taken in the sitting  position that covers only the hips and buttocks. It may be used for either healing or hygiene purposes. Sitz baths are also used to relieve pain, itching, or muscle spasms. The water may contain medicine. Moist heat will help you heal and relax.  HOME CARE INSTRUCTIONS  Take 3 to 4 sitz baths a day. 2. Fill the bathtub half full with warm water. 3. Sit in the water and open the drain a little. 4. Turn on the warm water to keep the tub half full. Keep the water running constantly. 5. Soak in the water for 15 to 20 minutes. 6. After the sitz bath, pat the affected area dry first.   4. KEEP YOUR BOWELS REGULAR a. The goal is one bowel movement a day b. Avoid getting constipated.  Between the surgery and the pain medications, it is common to experience some constipation.  Increasing fluid intake and taking a fiber supplement (such as Metamucil, Citrucel, FiberCon, MiraLax, etc) 1-2 times a day regularly will usually help prevent this problem from occurring.  A mild laxative (prune juice, Milk of Magnesia, MiraLax, etc) should be taken according to package directions if there are no bowel movements after 48 hours. c. Watch out for diarrhea.  If you have many loose  bowel movements, simplify your diet to bland foods & liquids for a few days.  Stop any stool softeners and decrease your fiber supplement.  Switching to mild anti-diarrheal medications (Kayopectate, Pepto Bismol) can help.  If this worsens or does not improve, please call us.  5. Wound Care  a. Remove your bandages the day after surgery.  Unless discharge instructions indicate otherwise, leave your bandage dry and in place overnight.  Remove the bandage during your first bowel movement.   b. Wear an absorbent pad or soft cotton gauze in your underwear as needed to catch any drainage and help keep the area  c. Keep the area clean and dry.  Bathe / shower every day.  Keep the area clean by showering / bathing over the incision / wound.   It is  okay to soak an open wound to help wash it.  Wet wipes or showers / gentle washing after bowel movements is often less traumatic than regular toilet paper. d. Dennis Bast will often notice bleeding with bowel movements.  This should slow down by the end of the first week of surgery e. Expect some drainage.  This should slow down, too, by the end of the first week of surgery.  Wear an absorbent pad or soft cotton gauze in your underwear until the drainage stops.  6. ACTIVITIES as tolerated:   a. You may resume regular (light) daily activities beginning the next day--such as daily self-care, walking, climbing stairs--gradually increasing activities as tolerated.  If you can walk 30 minutes without difficulty, it is safe to try more intense activity such as jogging, treadmill, bicycling, low-impact aerobics, swimming, etc. b. Save the most intensive and strenuous activity for last such as sit-ups, heavy lifting, contact sports, etc  Refrain from any heavy lifting or straining until you are off narcotics for pain control.   c. DO NOT PUSH THROUGH PAIN.  Let pain be your guide: If it hurts to do something, don't do it.  Pain is your body warning you to avoid that activity for another week until the pain goes down. d. You may drive when you are no longer taking prescription pain medication, you can comfortably sit for long periods of time, and you can safely maneuver your car and apply brakes. e. Dennis Bast may have sexual intercourse when it is comfortable.  7. FOLLOW UP in our office a. Please call [facility] at [phone] to set up an appointment to see your surgeon in the office for a follow-up appointment approximately 2 weeks after your surgery. b. Make sure that you call for this appointment the day you arrive home to insure a convenient appointment time. 10. IF YOU HAVE DISABILITY OR FAMILY LEAVE FORMS, BRING THEM TO THE OFFICE FOR PROCESSING.  DO NOT GIVE THEM TO YOUR DOCTOR.        WHEN TO CALL us [phone]  [phone]: 1. Poor pain control 2. Reactions / problems with new medications (rash/itching, nausea, etc)  3. Fever over 101.5 F (38.5 C) 4. Inability to urinate 5. Nausea and/or vomiting 6. Worsening swelling or bruising 7. Continued bleeding from incision. 8. Increased pain, redness, or drainage from the incision  The clinic staff is available to answer your questions during regular business hours (8:30am-5pm).  Please dont hesitate to call and ask to speak to one of our nurses for clinical concerns.   A surgeon from [facility] is always on call at the hospitals   If you have a medical emergency, go  to the nearest emergency room or call 911.    [facility], [address], [place], [place], [place]  [address] ? MAIN: [phone] ? TOLL FREE: [phone] ? FAX [phone] [email or web address]

## 2016-11-29 NOTE — Op Note (Signed)
11/29/2016  9:16 AM  PATIENT:  Carl Hogan  60 y.o. male  Patient Care Team: [name], MD as PCP - General (Internal Medicine) Michael Boston, MD as Consulting Physician (General Surgery) Wonda Horner, MD as Consulting Physician (Gastroenterology)  PRE-OPERATIVE DIAGNOSIS:    ANAL CANAL MASS  POST-OPERATIVE DIAGNOSIS:    ANAL CANAL MASS ANAL TAG  PROCEDURE:    EXAM UNDER ANESTHESIA EXCISION ANAL CANAL MASS EXCISION OF ANAL TAG  SURGEON:  Adin Hector, MD  ANESTHESIA:   General Anorectal & Local field block  0.25% bupivacaine with epinephrine at the beginning of the case. Liposomal bupivacaine (Experel) at the end of the case.  EBL:  Total I/O In: -  Out: 5 [Blood:5].  See operative record  Delay start of Pharmacological VTE agent (>24hrs) due to surgical blood loss or risk of bleeding:  NO  DRAINS: NONE  SPECIMEN:  Source of Specimen:  1.  ANAL CANAL MASS  2.  ANAL TAG  DISPOSITION OF SPECIMEN:  PATHOLOGY  COUNTS:  YES  PLAN OF CARE: Discharge home after PACU  PATIENT DISPOSITION:  PACU - hemodynamically stable.  INDICATION: Pleasant patient noted with anal canal mass on colonoscopy.  Since her surgical referral.  Pedunculated.  I recommended examination under anesthesia and surgical treatment:  The anatomy & physiology of the anorectal region was discussed.  The pathophysiology of hemorrhoids, anal masses, anal canal polyps, and differential diagnosis was discussed.  Natural history risks without surgery was discussed.   I stressed the importance of a bowel regimen to have daily soft bowel movements to minimize progression of disease.  Interventions such as sclerotherapy & banding were discussed.  [name] symptoms are not adequately controlled by medicines and other non-operative treatments.  I feel the risks & problems of no surgery outweigh the operative risks; therefore, I recommended surgery to treat the hemorrhoids by ligation,  pexy, and possible resection.  Risks such as bleeding, infection, need for further treatment, heart attack, death, and other risks were discussed.   I noted a good likelihood this will help address the problem.  Goals of post-operative recovery were discussed as well.  Possibility that this will not correct all symptoms was explained.  Post-operative pain, bleeding, constipation, urinary difficulties, and other problems after surgery were discussed.  We will work to minimize complications.   Educational handouts further explaining the pathology, treatment options, and bowel regimen were given as well.  Questions were answered.  The patient expresses understanding & wishes to proceed with surgery.  OR FINDINGS: Posterior anal canal mass, smooth and fibrotic, 2 x 1 cm, pedunculated at white line of hinton.  Most likely hypertrophic anal papilla  Small anal tag right posterior aspect.  DESCRIPTION:   Informed consent was confirmed. Patient underwent general anesthesia without difficulty. Patient was placed into prone positioning.  The perianal region was prepped and draped in sterile fashion. Surgical time-out confirmed our plan.  I did digital rectal examination and then transitioned over to anoscopy to get a sense of the anatomy.  He had some mild white skin patient's and irritation consistent with mild pruritis is most likely from the anal mass chronically prolapsing.  No fissure.  No fistula.  No pilonidal disease.  No condyloma.  Smooth and not enlarged.  Findings noted above.   Proceeded to excise the pedunculated anal canal mass.  Recently meters proximal to the anal verge.    I used a 2-0 Vicryl suture on a UR-6 needle in a  figure-of-eight fashion 5 cm proximal to the anal verge. I excised the  pedunculated anal canal mass, sparing rectal wall & anoderm to avoid narrowing.  I then ran that stitch longitudinally more distally  and then back proximally to close the hemorrhoidectomy wound to the anal  verge over a large Hill-Furgeson retarctor to avoid narrowing of the anal canal.  I then tied that stitch down.   patient had a mildly irritated right posterior 1 x 1 cm anal tag.  I excised that radially in closed the wound with interrupted 3-0 chromic suture.  I redid anoscopy  and digital examination.   Hemostasis was good.  No other abnormalities noted.  There is no prolapse.  External anatomy looked much more normal.  Hemostasis was good.  Patient was extubated and comfortable in the PACU recovery room.  I had discussed postop care in detail with the patient & his spouse in the preop holding area.  Instructions for post-operative recovery and prescriptions are written. I discussed operative findings, updated the patient's status, discussed probable steps to recovery, and gave postoperative recommendations to the patient's spouse.  Recommendations were made.  Questions were answered.  She expressed understanding & appreciation.  Adin Hector, M.D., F.A.C.S. Gastrointestinal and Minimally Invasive Surgery [facility] [address] [place], [place] [address] [phone] Main / Paging

## 2016-11-29 NOTE — Transfer of Care (Signed)
Immediate Anesthesia Transfer of Care Note  Patient: Carl Hogan  Procedure(s) Performed: Procedure(s): EXAM UNDER ANESTHESIA (N/A) EXCISION ANAL CANAL MASS (N/A)  Patient Location: PACU  Anesthesia Type:MAC  Level of Consciousness: awake, alert  and oriented  Airway & Oxygen Therapy: Patient Spontanous Breathing and Patient connected to nasal cannula oxygen  Post-op Assessment: Report given to RN  Post vital signs: Reviewed and stable  Last Vitals: 104/72 Vitals:   11/29/16 0721 11/29/16 0919  BP: 117/75   Pulse: 98 88  Resp: 16   Temp: 36.7 C 36.5 C    Last Pain:  Vitals:   11/29/16 0721  TempSrc: Oral         Complications: No apparent anesthesia complications

## 2016-11-29 NOTE — Anesthesia Postprocedure Evaluation (Signed)
Anesthesia Post Note  Patient: Carl Hogan  Procedure(s) Performed: Procedure(s) (LRB): EXAM UNDER ANESTHESIA (N/A) EXCISION ANAL CANAL MASS (N/A)  Patient location during evaluation: PACU Anesthesia Type: MAC Level of consciousness: awake and alert Pain management: pain level controlled Vital Signs Assessment: post-procedure vital signs reviewed and stable Respiratory status: spontaneous breathing, nonlabored ventilation, respiratory function stable and patient connected to nasal cannula oxygen Cardiovascular status: stable and blood pressure returned to baseline Anesthetic complications: no       Last Vitals:  Vitals:   11/29/16 0945 11/29/16 1000  BP: 108/72 108/80  Pulse: 83 81  Resp: 16 10  Temp:      Last Pain:  Vitals:   11/29/16 0945  TempSrc:   PainSc: 0-No pain                 [name]

## 2016-11-30 ENCOUNTER — Encounter (HOSPITAL_BASED_OUTPATIENT_CLINIC_OR_DEPARTMENT_OTHER): Payer: Self-pay | Admitting: Surgery

## 2016-11-30 MED FILL — ZOLPIDEM TARTRATE 5 MG TAB: 5 | 30 days supply | Qty: 30 | Fill #0

## 2016-12-05 MED FILL — AMOXICILLIN 500 MG CAPSULE: 500 | 2 days supply | Qty: 8 | Fill #0

## 2016-12-07 ENCOUNTER — Encounter: Payer: Self-pay | Admitting: *Deleted

## 2016-12-19 MED FILL — CIALIS 20 MG TABLET: 20 | 30 days supply | Qty: 6 | Fill #3

## 2016-12-29 MED FILL — LEVOTHYROXINE 25 MCG TABLET: 25 | 30 days supply | Qty: 30 | Fill #2

## 2017-01-16 IMAGING — MR MR HEAD WO/W CM
9 of 12 series · 35 of 48 positions shown · IV contrast (multihance)
Comparison: None.

CLINICAL DATA: 60-year-old male with altered mental status.
Disoriented with mood swings. Headaches. Trouble sleeping for 4
months. Brain injury August 2012.

EXAM:
MRI HEAD WITHOUT AND WITH CONTRAST
TECHNIQUE: Multiplanar, multiecho pulse sequences of the brain and surrounding
structures were obtained without and with intravenous contrast.
CONTRAST:  20mL MULTIHANCE GADOBENATE DIMEGLUMINE 529 MG/ML IV SOLN

[Series 3: T1 · sagittal · 5.0mm · 0.47mm/px · 2 of 24 slices shown]
[im 1/24]
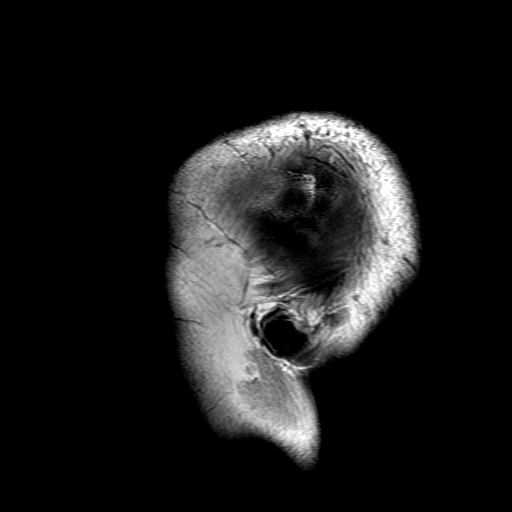
[im 12/24]
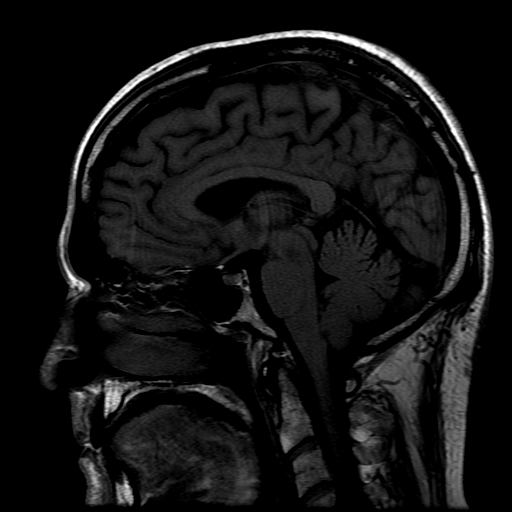

[Series 4: DWI · axial · 3.0mm · 1.09mm/px · z∈[-73,+74]mm · 9 of 100 slices shown (1 of 4)]
[im 1/100]
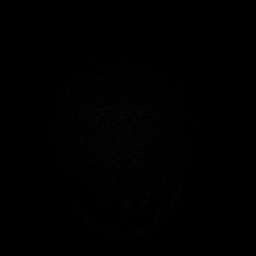
[im 13/100]
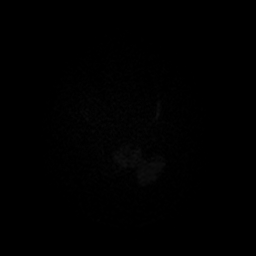
[im 25/100]
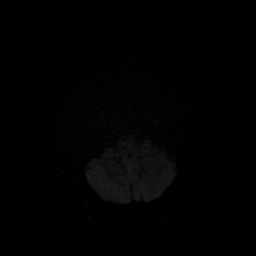
[im 38/100]
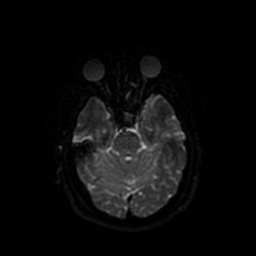
[im 50/100]
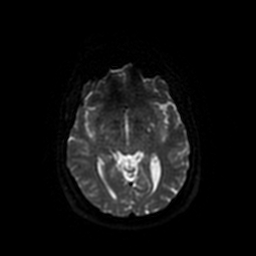
[im 62/100]
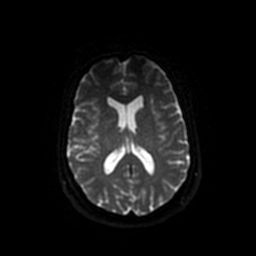
[im 75/100]
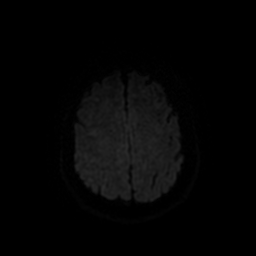
[im 87/100]
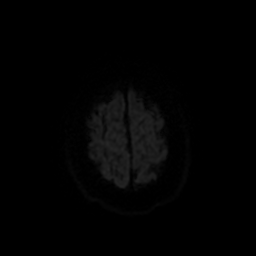
[im 100/100]
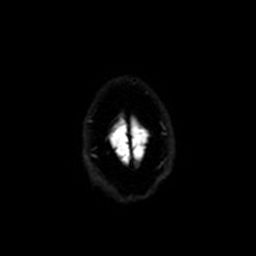

[Series 5: DWI · coronal · 5.0mm · 1.09mm/px · 6 of 68 slices shown (2 of 4)]
[im 1/68]
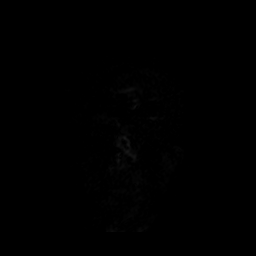
[im 14/68]
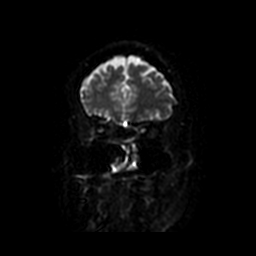
[im 27/68]
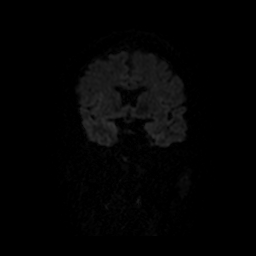
[im 41/68]
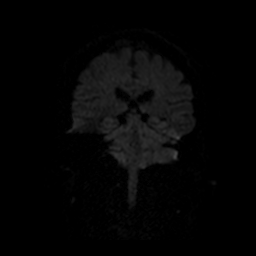
[im 54/68]
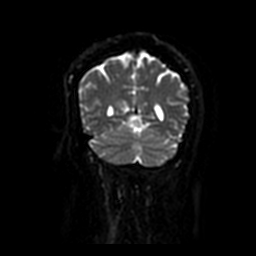
[im 68/68]
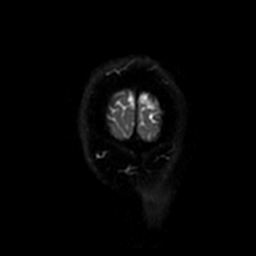

[Series 6: T2 · axial · 5.0mm · 0.47mm/px · z∈[-73,+76]mm · 2 of 24 slices shown]
[im 1/24]
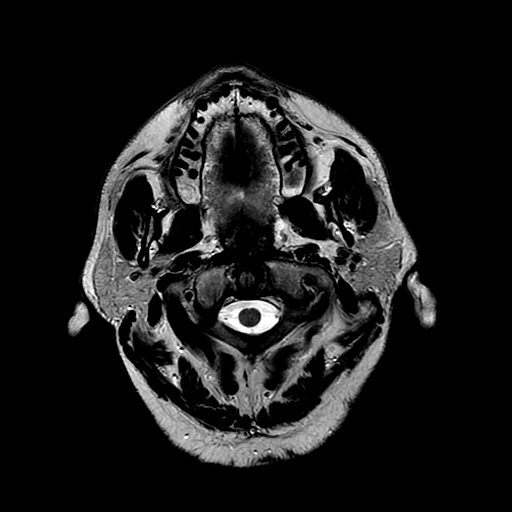
[im 24/24]
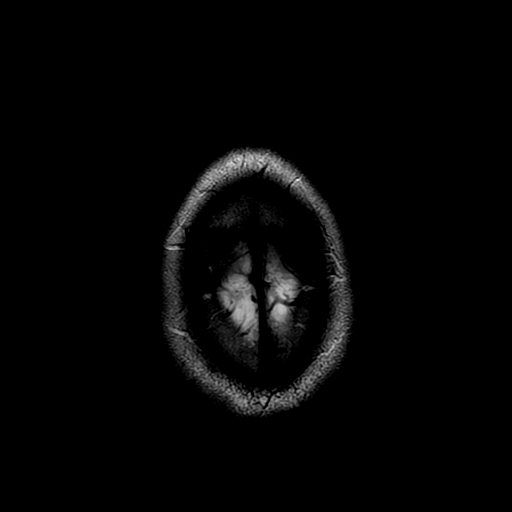

[Series 7: FLAIR · axial · 5.0mm · 0.47mm/px · z∈[-73,+76]mm · 2 of 24 slices shown]
[im 1/24]
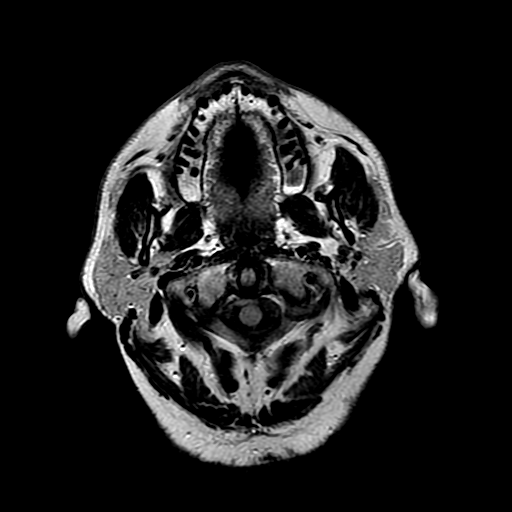
[im 24/24]
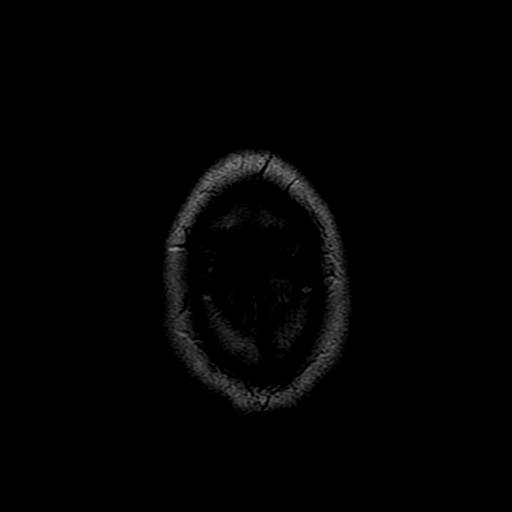

[Series 10: T2 post-contrast · coronal · 5.0mm · 0.45mm/px · 3 of 27 slices shown]
[im 1/27]
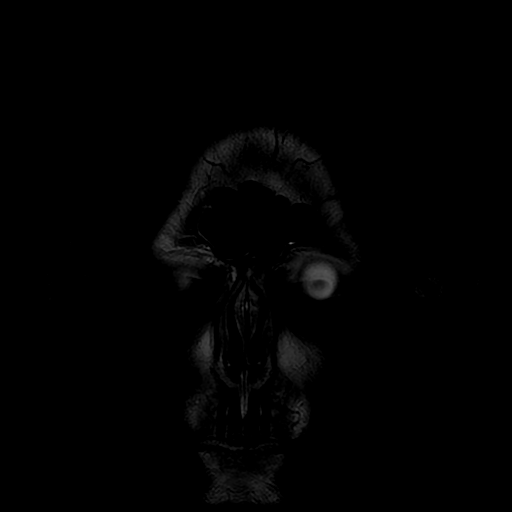
[im 14/27]
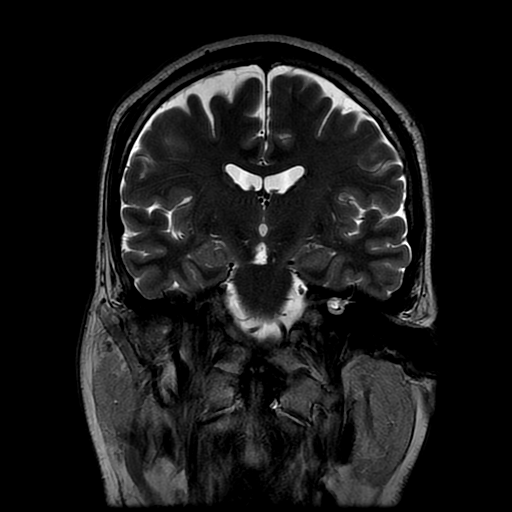
[im 27/27]
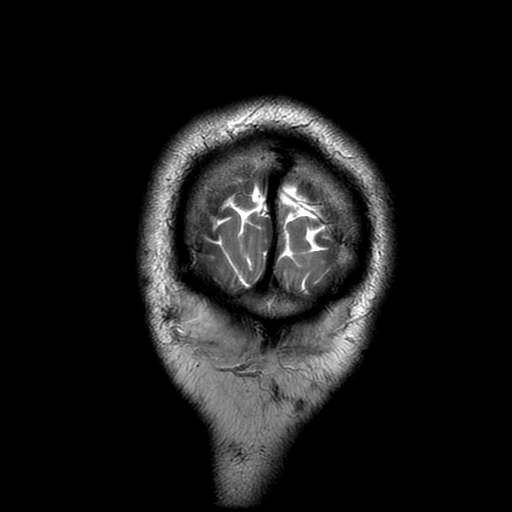

[Series 12: T1 post-contrast · coronal · 5.0mm · 0.45mm/px · 3 of 27 slices shown]
[im 1/27]
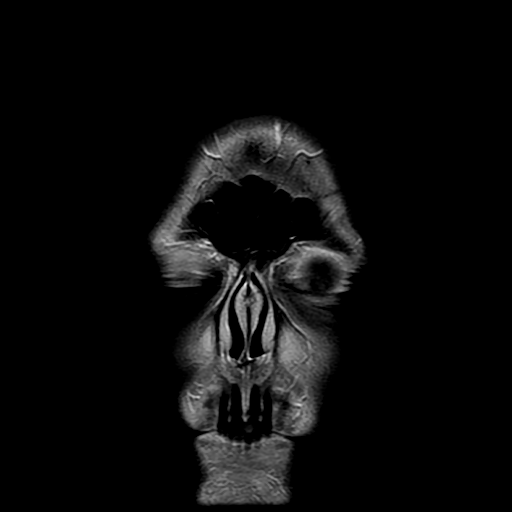
[im 14/27]
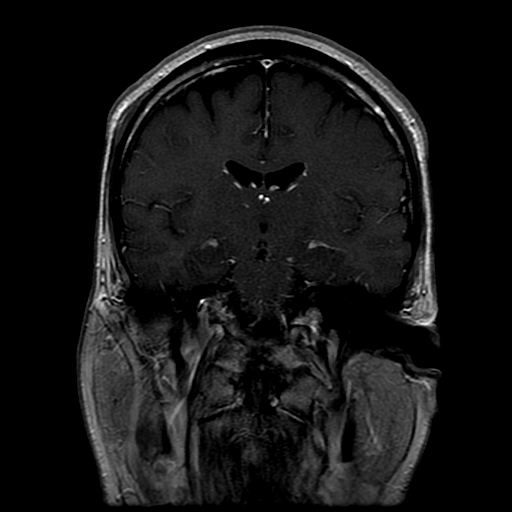
[im 27/27]
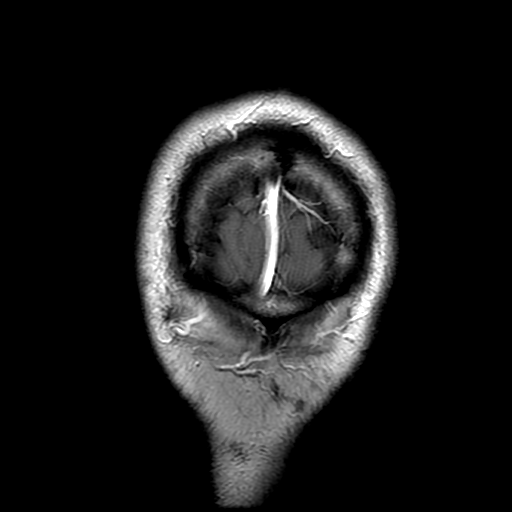

[Series 402: DWI · axial · 3.0mm · 1.09mm/px · z∈[-73,+74]mm · 5 of 50 slices shown (3 of 4)]
[im 1/50]
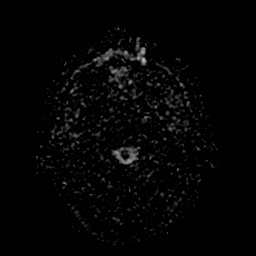
[im 13/50]
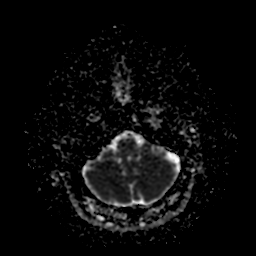
[im 25/50]
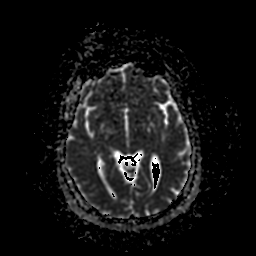
[im 37/50]
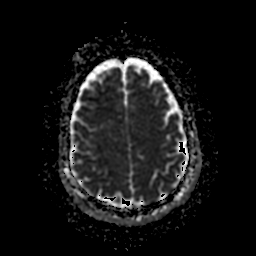
[im 50/50]
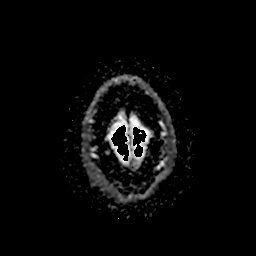

[Series 501: DWI · coronal · 5.0mm · 1.09mm/px · 3 of 34 slices shown (4 of 4)]
[im 1/34]
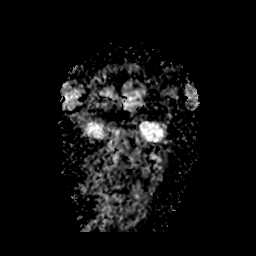
[im 17/34]
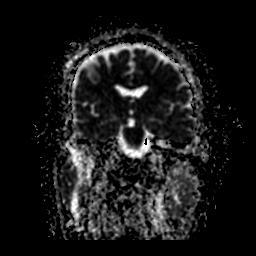
[im 34/34]
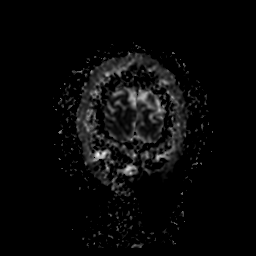

[35 of 48 positions shown; findings below may reference images not displayed]

FINDINGS: Brain: No acute infarct or intracranial hemorrhage.

No intracranial mass or abnormal enhancement.

No age advanced atrophy or hydrocephalus.

Vascular: Major intracranial vascular structures are patent.

Skull and upper cervical spine: C3-4 mild bulge with minimal cord
contact. Right cerebellar tonsil minimally low lying within the
range of normal limits.

Sinuses/Orbits: No acute orbital abnormality. Minimal to mild
mucosal thickening maxillary sinuses, ethmoid sinus air cells,
frontal sinuses and left sphenoid sinus.

Other: Negative.
IMPRESSION: No intracranial mass or abnormal enhancement.

Minimal to mild paranasal sinus mucosal thickening sparing the right
sphenoid sinus.

C3-4 bulge with mild spinal stenosis.

## 2017-02-06 DIAGNOSIS — E785 Hyperlipidemia, unspecified: Secondary | ICD-10-CM | POA: Diagnosis not present

## 2017-02-06 DIAGNOSIS — E039 Hypothyroidism, unspecified: Secondary | ICD-10-CM | POA: Diagnosis not present

## 2017-02-06 DIAGNOSIS — N529 Male erectile dysfunction, unspecified: Secondary | ICD-10-CM | POA: Diagnosis not present

## 2017-02-06 DIAGNOSIS — E559 Vitamin D deficiency, unspecified: Secondary | ICD-10-CM | POA: Diagnosis not present

## 2017-02-06 MED FILL — SILDENAFIL 100 MG TABLET: 100 | 30 days supply | Qty: 6 | Fill #0

## 2017-02-06 MED FILL — LEVOTHYROXINE 50 MCG TABLET: 50 | 30 days supply | Qty: 30 | Fill #0

## 2017-02-08 MED FILL — AMOXICILLIN 500 MG CAPSULE: 500 | 2 days supply | Qty: 8 | Fill #0

## 2017-02-08 MED FILL — ZOLPIDEM TARTRATE 5 MG TAB: 5 | 30 days supply | Qty: 30 | Fill #0

## 2017-03-11 MED FILL — ZOLPIDEM TARTRATE 5 MG TAB: 5 | 30 days supply | Qty: 30 | Fill #1

## 2017-03-11 MED FILL — LEVOTHYROXINE 50 MCG TABLET: 50 | 30 days supply | Qty: 30 | Fill #1

## 2017-03-22 DIAGNOSIS — E039 Hypothyroidism, unspecified: Secondary | ICD-10-CM | POA: Diagnosis not present

## 2017-03-22 DIAGNOSIS — E785 Hyperlipidemia, unspecified: Secondary | ICD-10-CM | POA: Diagnosis not present

## 2017-03-22 DIAGNOSIS — E559 Vitamin D deficiency, unspecified: Secondary | ICD-10-CM | POA: Diagnosis not present

## 2017-04-12 MED FILL — AMOXICILLIN 500 MG CAPSULE: 500 | 2 days supply | Qty: 8 | Fill #0

## 2017-04-13 MED FILL — LEVOTHYROXINE 50 MCG TABLET: 50 | 30 days supply | Qty: 30 | Fill #2

## 2017-04-24 DIAGNOSIS — F330 Major depressive disorder, recurrent, mild: Secondary | ICD-10-CM | POA: Diagnosis not present

## 2017-05-08 DIAGNOSIS — F330 Major depressive disorder, recurrent, mild: Secondary | ICD-10-CM | POA: Diagnosis not present

## 2017-05-13 MED FILL — SILDENAFIL 100 MG TABLET: 100 | 30 days supply | Qty: 6 | Fill #1

## 2017-05-17 DIAGNOSIS — F330 Major depressive disorder, recurrent, mild: Secondary | ICD-10-CM | POA: Diagnosis not present

## 2017-06-12 DIAGNOSIS — F330 Major depressive disorder, recurrent, mild: Secondary | ICD-10-CM | POA: Diagnosis not present

## 2017-07-10 DIAGNOSIS — L219 Seborrheic dermatitis, unspecified: Secondary | ICD-10-CM | POA: Diagnosis not present

## 2017-07-10 DIAGNOSIS — D229 Melanocytic nevi, unspecified: Secondary | ICD-10-CM | POA: Diagnosis not present

## 2017-07-10 DIAGNOSIS — H02739 Vitiligo of unspecified eye, unspecified eyelid and periocular area: Secondary | ICD-10-CM | POA: Diagnosis not present

## 2017-07-10 DIAGNOSIS — L821 Other seborrheic keratosis: Secondary | ICD-10-CM | POA: Diagnosis not present

## 2017-07-12 DIAGNOSIS — E039 Hypothyroidism, unspecified: Secondary | ICD-10-CM | POA: Diagnosis not present

## 2017-07-12 DIAGNOSIS — E291 Testicular hypofunction: Secondary | ICD-10-CM | POA: Diagnosis not present

## 2017-07-12 DIAGNOSIS — E559 Vitamin D deficiency, unspecified: Secondary | ICD-10-CM | POA: Diagnosis not present

## 2017-07-12 DIAGNOSIS — M542 Cervicalgia: Secondary | ICD-10-CM | POA: Diagnosis not present

## 2017-07-12 DIAGNOSIS — M25511 Pain in right shoulder: Secondary | ICD-10-CM | POA: Diagnosis not present

## 2017-07-12 DIAGNOSIS — M545 Low back pain: Secondary | ICD-10-CM | POA: Diagnosis not present

## 2017-07-12 DIAGNOSIS — R5383 Other fatigue: Secondary | ICD-10-CM | POA: Diagnosis not present

## 2017-07-12 MED FILL — LEVOTHYROXINE 50 MCG TABLET: 50 | 30 days supply | Qty: 30 | Fill #0

## 2017-07-12 MED FILL — ZOLPIDEM TARTRATE 5 MG TAB: 5 | 30 days supply | Qty: 15 | Fill #0

## 2017-07-12 MED FILL — HYDROCORTISONE 2.5% OINT: 2.5 | 10 days supply | Qty: 28 | Fill #0

## 2017-07-16 DIAGNOSIS — M19011 Primary osteoarthritis, right shoulder: Secondary | ICD-10-CM | POA: Diagnosis not present

## 2017-08-13 ENCOUNTER — Ambulatory Visit (HOSPITAL_COMMUNITY)
Admission: RE | Admit: 2017-08-13 | Discharge: 2017-08-13 | Disposition: A | Discharge status: Home / Self Care | Payer: BLUE CROSS/BLUE SHIELD | Source: Ambulatory Visit | Attending: Internal Medicine | Admitting: Internal Medicine

## 2017-08-13 ENCOUNTER — Other Ambulatory Visit (HOSPITAL_COMMUNITY): Payer: Self-pay | Admitting: Internal Medicine

## 2017-08-13 DIAGNOSIS — M50323 Other cervical disc degeneration at C6-C7 level: Secondary | ICD-10-CM | POA: Diagnosis not present

## 2017-08-13 DIAGNOSIS — M4316 Spondylolisthesis, lumbar region: Secondary | ICD-10-CM | POA: Insufficient documentation

## 2017-08-13 DIAGNOSIS — M542 Cervicalgia: Secondary | ICD-10-CM | POA: Diagnosis present

## 2017-08-13 DIAGNOSIS — M545 Low back pain: Secondary | ICD-10-CM

## 2017-08-13 DIAGNOSIS — M5137 Other intervertebral disc degeneration, lumbosacral region: Secondary | ICD-10-CM | POA: Diagnosis not present

## 2017-08-13 DIAGNOSIS — M5136 Other intervertebral disc degeneration, lumbar region: Secondary | ICD-10-CM | POA: Diagnosis not present

## 2017-08-13 MED FILL — TESTOSTERONE CYP 200 MG/ML: 200 | 84 days supply | Qty: 6 | Fill #0

## 2017-08-13 MED FILL — LEVOTHYROXINE 50 MCG TABLET: 50 | 30 days supply | Qty: 30 | Fill #1

## 2017-08-13 MED FILL — ZOLPIDEM TARTRATE 5 MG TAB: 5 | 30 days supply | Qty: 15 | Fill #1

## 2017-08-23 ENCOUNTER — Other Ambulatory Visit (HOSPITAL_COMMUNITY): Payer: Self-pay | Admitting: Internal Medicine

## 2017-08-23 DIAGNOSIS — M545 Low back pain: Secondary | ICD-10-CM

## 2017-09-06 ENCOUNTER — Ambulatory Visit (HOSPITAL_COMMUNITY)
Admission: RE | Admit: 2017-09-06 | Discharge: 2017-09-06 | Disposition: A | Discharge status: Home / Self Care | Payer: BLUE CROSS/BLUE SHIELD | Source: Ambulatory Visit | Attending: Internal Medicine | Admitting: Internal Medicine

## 2017-09-06 DIAGNOSIS — M48061 Spinal stenosis, lumbar region without neurogenic claudication: Secondary | ICD-10-CM | POA: Diagnosis not present

## 2017-09-06 DIAGNOSIS — M4696 Unspecified inflammatory spondylopathy, lumbar region: Secondary | ICD-10-CM | POA: Insufficient documentation

## 2017-09-06 DIAGNOSIS — M5137 Other intervertebral disc degeneration, lumbosacral region: Secondary | ICD-10-CM | POA: Insufficient documentation

## 2017-09-06 DIAGNOSIS — M545 Low back pain: Secondary | ICD-10-CM | POA: Diagnosis not present

## 2017-09-06 DIAGNOSIS — M5126 Other intervertebral disc displacement, lumbar region: Secondary | ICD-10-CM | POA: Diagnosis not present

## 2017-09-06 DIAGNOSIS — M5136 Other intervertebral disc degeneration, lumbar region: Secondary | ICD-10-CM | POA: Insufficient documentation

## 2017-09-10 DIAGNOSIS — M19011 Primary osteoarthritis, right shoulder: Secondary | ICD-10-CM | POA: Diagnosis not present

## 2017-09-11 MED FILL — HYDROCORTISONE 2.5% OINT: 2.5 | 10 days supply | Qty: 28 | Fill #1

## 2017-09-12 ENCOUNTER — Other Ambulatory Visit (HOSPITAL_COMMUNITY): Payer: Self-pay | Admitting: Orthopaedic Surgery

## 2017-09-12 DIAGNOSIS — M25511 Pain in right shoulder: Secondary | ICD-10-CM

## 2017-09-12 MED FILL — ZOLPIDEM TARTRATE 5 MG TABL: 5 | 30 days supply | Qty: 15 | Fill #2

## 2017-09-19 MED FILL — LEVOTHYROXINE 50 MCG TABLET: 50 | 90 days supply | Qty: 90 | Fill #0

## 2017-10-03 DIAGNOSIS — E291 Testicular hypofunction: Secondary | ICD-10-CM | POA: Diagnosis not present

## 2017-10-03 DIAGNOSIS — R196 Halitosis: Secondary | ICD-10-CM | POA: Diagnosis not present

## 2017-10-03 DIAGNOSIS — J209 Acute bronchitis, unspecified: Secondary | ICD-10-CM | POA: Diagnosis not present

## 2017-10-03 DIAGNOSIS — E039 Hypothyroidism, unspecified: Secondary | ICD-10-CM | POA: Diagnosis not present

## 2017-10-03 MED FILL — AZITHROMYCIN 250 MG TAB: 250 | 5 days supply | Qty: 6 | Fill #0

## 2017-10-03 MED FILL — predniSONE 20 MG TABS: 20 | 5 days supply | Qty: 10 | Fill #0

## 2017-10-03 MED FILL — NAPROXEN 500 MG TABLET: 500 | 15 days supply | Qty: 30 | Fill #0

## 2017-10-15 MED FILL — ZOLPIDEM TARTRATE 5 MG TABL: 5 | 30 days supply | Qty: 15 | Fill #0

## 2017-10-15 MED FILL — ACYCLOVIR 400 MG TABLET: 400 | 7 days supply | Qty: 21 | Fill #0

## 2017-11-13 DIAGNOSIS — M5440 Lumbago with sciatica, unspecified side: Secondary | ICD-10-CM | POA: Diagnosis not present

## 2017-11-15 MED FILL — ZOLPIDEM TARTRATE 5 MG TABL: 5 | 30 days supply | Qty: 15 | Fill #1

## 2017-11-15 MED FILL — AZITHROMYCIN 250 MG TAB: 250 | 5 days supply | Qty: 6 | Fill #0

## 2017-11-20 ENCOUNTER — Ambulatory Visit (HOSPITAL_COMMUNITY)
Admission: RE | Admit: 2017-11-20 | Discharge: 2017-11-20 | Disposition: A | Discharge status: Home / Self Care | Payer: BLUE CROSS/BLUE SHIELD | Source: Ambulatory Visit | Attending: Internal Medicine | Admitting: Internal Medicine

## 2017-11-20 ENCOUNTER — Other Ambulatory Visit (HOSPITAL_COMMUNITY): Payer: Self-pay | Admitting: Internal Medicine

## 2017-11-20 DIAGNOSIS — R059 Cough, unspecified: Secondary | ICD-10-CM

## 2017-11-20 DIAGNOSIS — J209 Acute bronchitis, unspecified: Secondary | ICD-10-CM | POA: Diagnosis not present

## 2017-11-20 DIAGNOSIS — R05 Cough: Secondary | ICD-10-CM | POA: Insufficient documentation

## 2017-12-03 DIAGNOSIS — M47816 Spondylosis without myelopathy or radiculopathy, lumbar region: Secondary | ICD-10-CM | POA: Diagnosis not present

## 2017-12-03 DIAGNOSIS — M48061 Spinal stenosis, lumbar region without neurogenic claudication: Secondary | ICD-10-CM | POA: Diagnosis not present

## 2017-12-11 ENCOUNTER — Other Ambulatory Visit: Payer: Self-pay | Admitting: Dermatology

## 2017-12-11 DIAGNOSIS — L209 Atopic dermatitis, unspecified: Secondary | ICD-10-CM | POA: Diagnosis not present

## 2017-12-11 DIAGNOSIS — D485 Neoplasm of uncertain behavior of skin: Secondary | ICD-10-CM | POA: Diagnosis not present

## 2017-12-18 MED FILL — ZOLPIDEM TARTRATE 5 MG TABL: 5 | 30 days supply | Qty: 15 | Fill #3

## 2017-12-18 MED FILL — SILDENAFIL CITRATE 100 MG T: 100 | 30 days supply | Qty: 6 | Fill #2

## 2018-01-01 IMAGING — DX DG LUMBAR SPINE 2-3V
2 series · 2 of 2 positions shown · non-contrast
Comparison: Lateral view of the chest which included a small
portion of the upper lumbar spine dated July 22, 2016.

CLINICAL DATA: Low back pain occasionally radiating into the left
leg and knee; no foot discomfort.

EXAM:
LUMBAR SPINE - 2-3 VIEW

[l-spine ap]
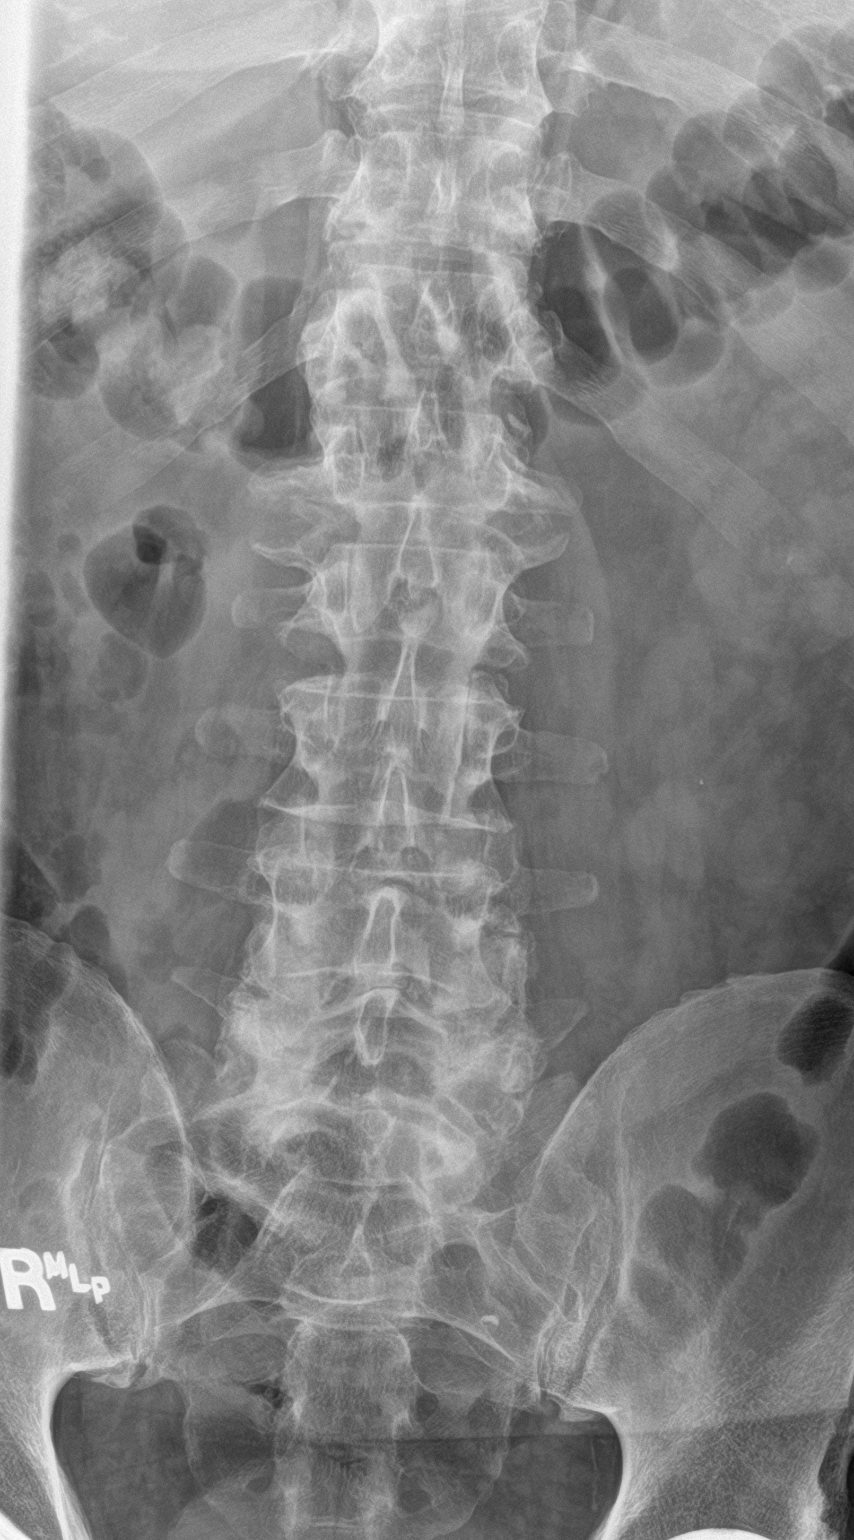

[l-spine lat]
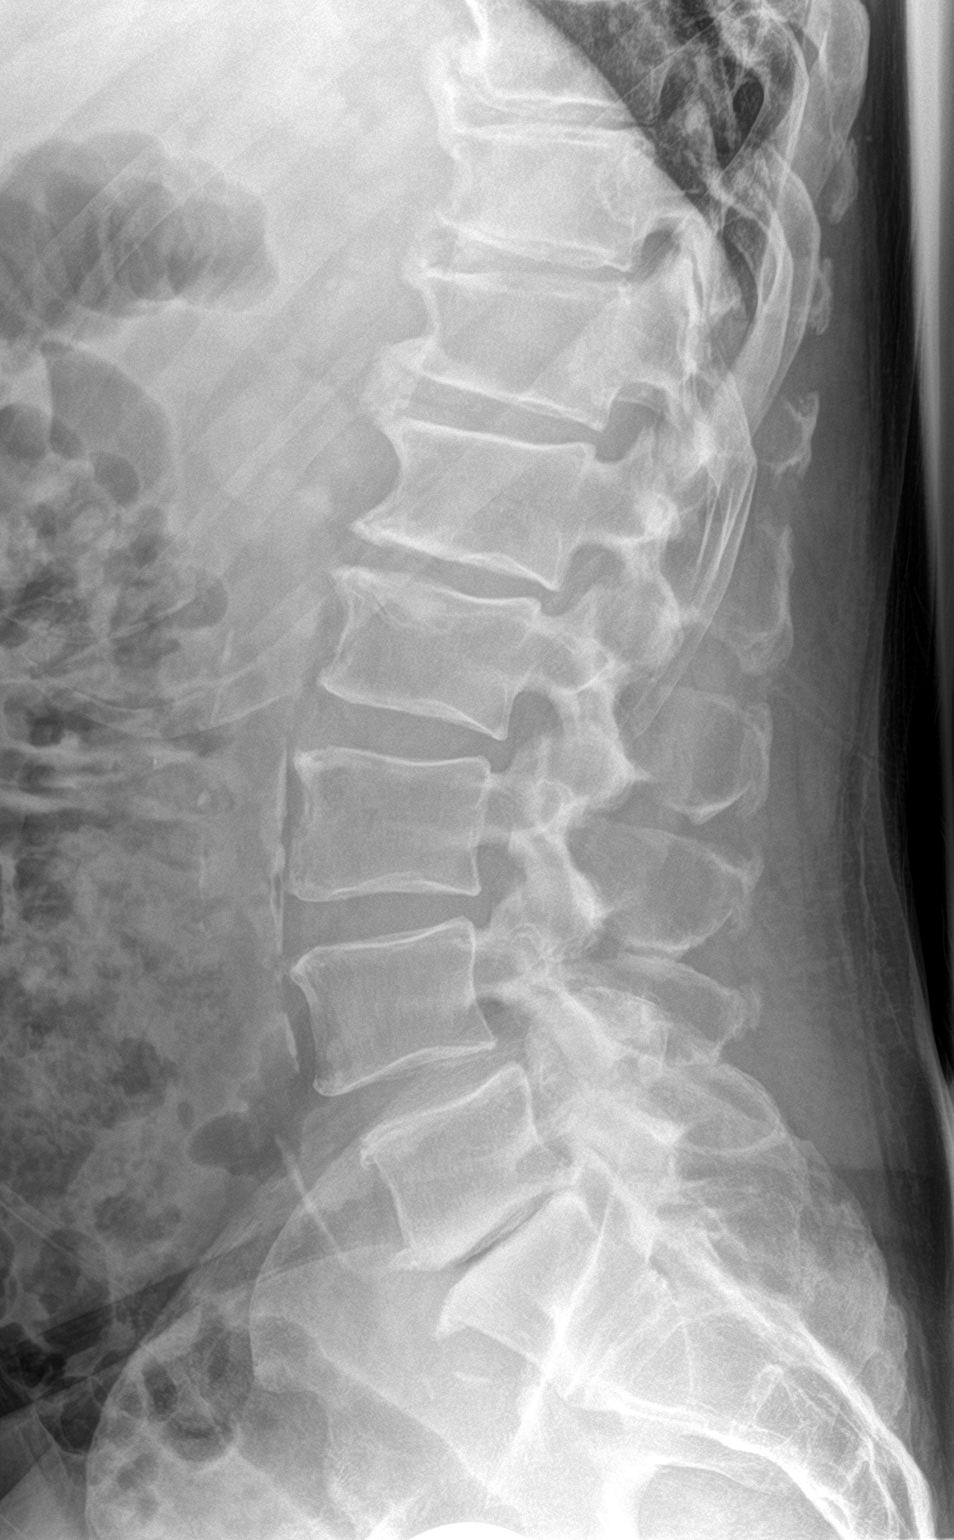

[2 of 2 positions shown; findings below may reference images not displayed]

FINDINGS: The lumbar vertebral bodies are preserved in height. There is
superior endplate depression of L2. There is grade 1 anterolisthesis
of L4 with respect L5. There is mild disc space narrowing at L4-5
and moderate narrowing at L5-S1. There is facet joint hypertrophy at
L4-5 and L5-S1. The pedicles and transverse processes are intact.
There is calcification in the wall of the abdominal aorta.
IMPRESSION: Degenerative disc change at L4-5 and L5-S1. Grade 1 anterolisthesis
of L4 with respect L5 likely on the basis of degenerative disc and
facet joint change. No compression fracture. Superior endplate
depression of L2 which is likely old.

## 2018-01-01 IMAGING — DX DG CERVICAL SPINE 2 OR 3 VIEWS
2 series · 2 of 2 positions shown · non-contrast
Comparison: None in PACs

CLINICAL DATA: Neck pain for while with occasional stiffness.

EXAM:
CERVICAL SPINE - 2-3 VIEW

[c-spine lat]
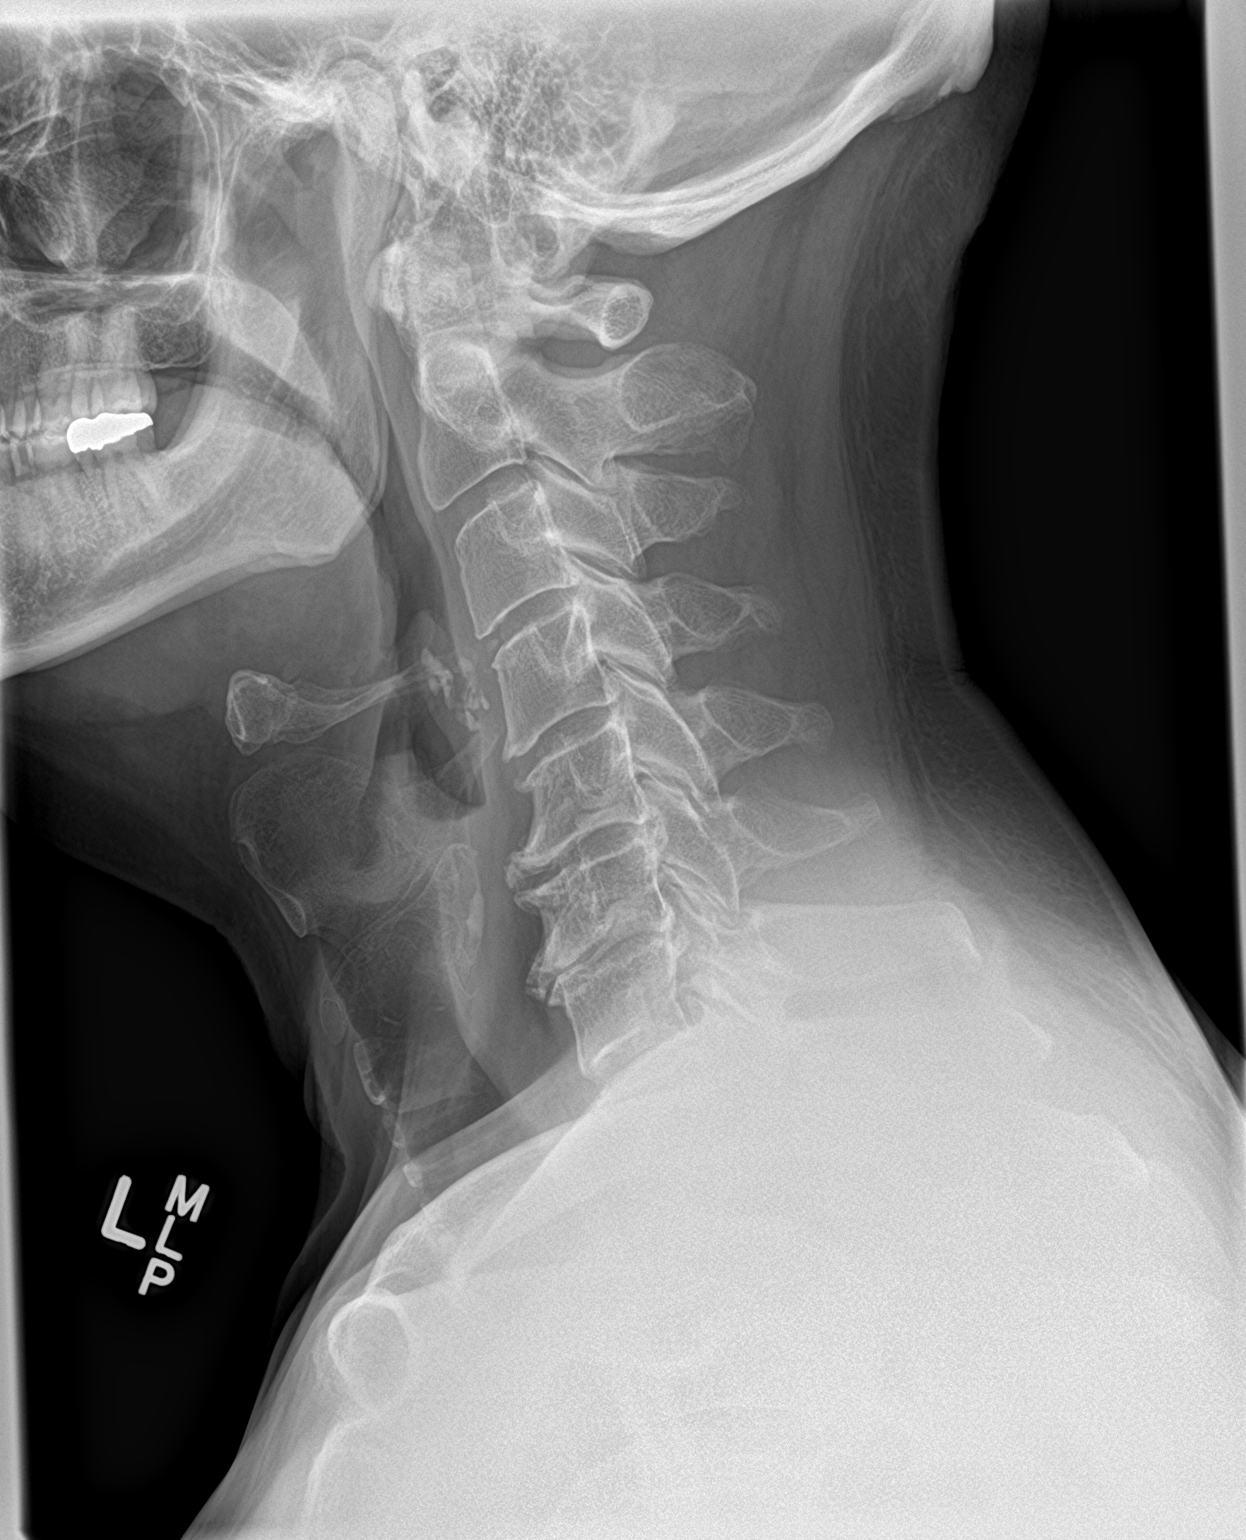

[c-spine ap]
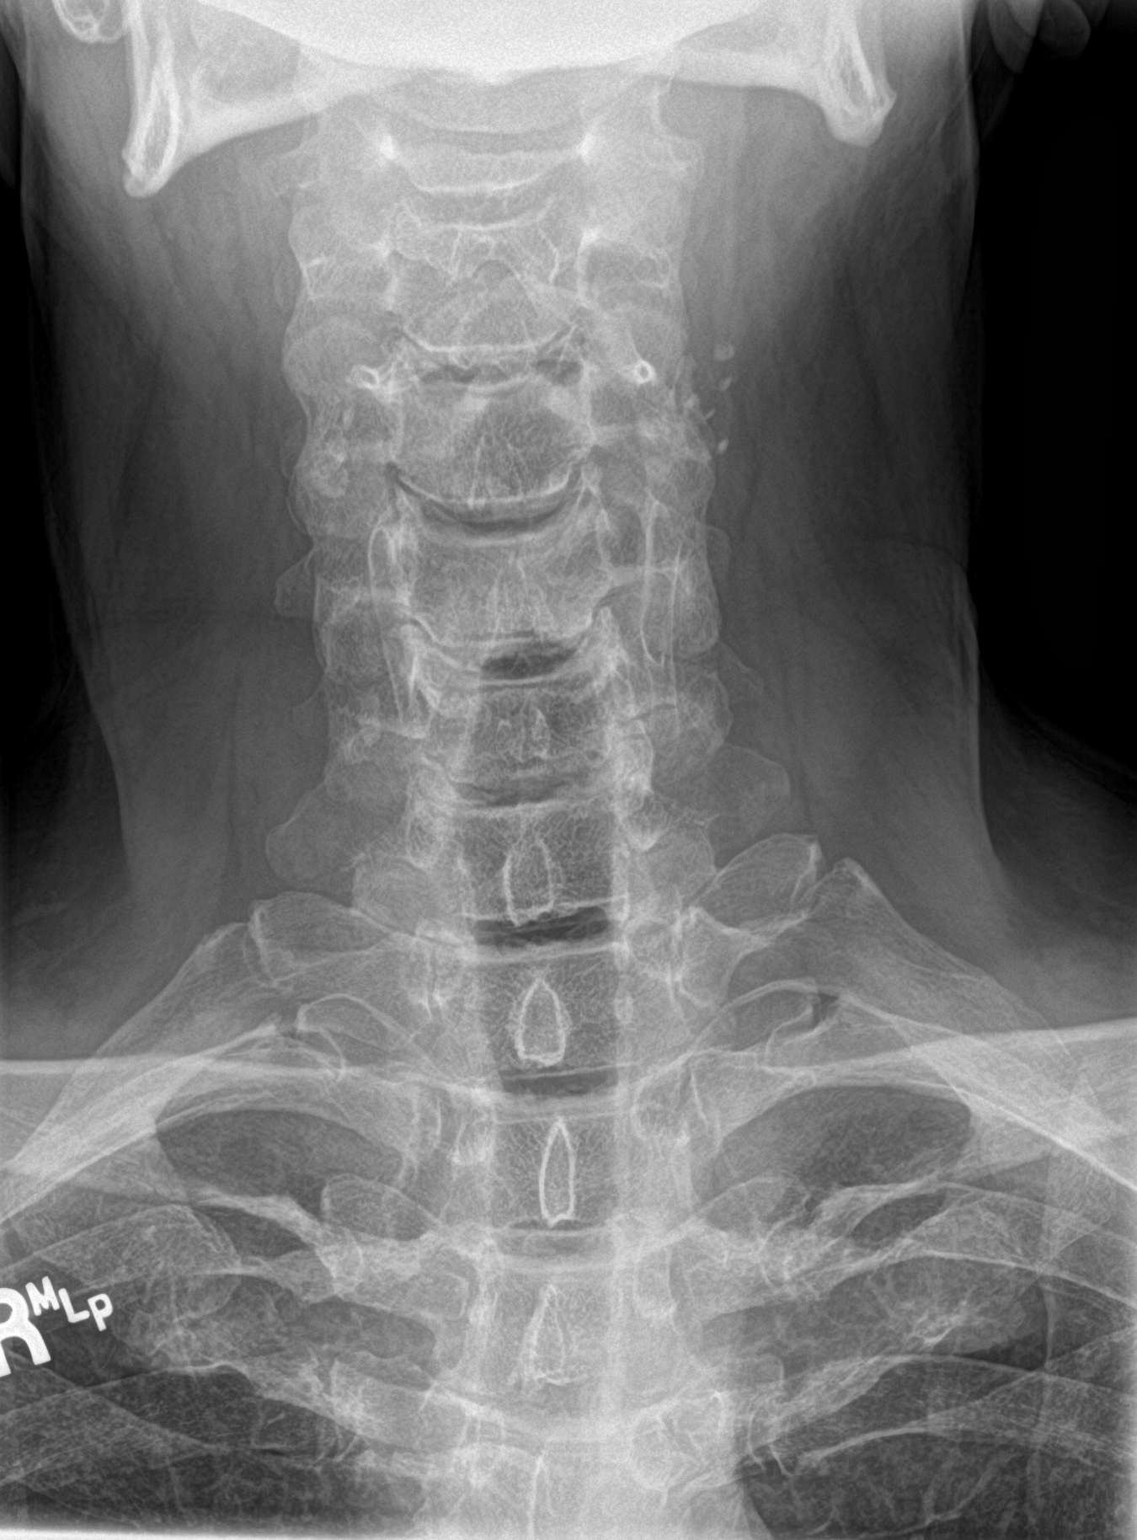

[2 of 2 positions shown; findings below may reference images not displayed]

FINDINGS: There is loss of the normal cervical lordosis. The vertebral bodies
are preserved in height. There is disc space narrowing at C5-6 and
C6-7. There are anterior endplate osteophytes from C4 through C7.
There is no perched facet or spinous process fracture. The
prevertebral soft tissue spaces are normal. The C1-C2 interval
appears normal on the lateral view.
IMPRESSION: There is degenerative disc disease from C4-5 through C6-7. There is
no compression fracture or spondylolisthesis. Loss of the normal
cervical lordosis likely reflects muscle spasm.

## 2018-01-09 MED FILL — LEVOTHYROXINE 50 MCG TABLET: 50 | 90 days supply | Qty: 90 | Fill #1

## 2018-01-22 DIAGNOSIS — M545 Low back pain: Secondary | ICD-10-CM | POA: Diagnosis not present

## 2018-01-22 DIAGNOSIS — M47816 Spondylosis without myelopathy or radiculopathy, lumbar region: Secondary | ICD-10-CM | POA: Diagnosis not present

## 2018-02-05 MED FILL — ACYCLOVIR 400 MG TABLET: 400 | 7 days supply | Qty: 21 | Fill #1

## 2018-02-13 DIAGNOSIS — M545 Low back pain: Secondary | ICD-10-CM | POA: Diagnosis not present

## 2018-02-13 DIAGNOSIS — M47816 Spondylosis without myelopathy or radiculopathy, lumbar region: Secondary | ICD-10-CM | POA: Diagnosis not present

## 2018-02-18 MED FILL — ZOLPIDEM TARTRATE 5 MG TABL: 5 | 30 days supply | Qty: 15 | Fill #2

## 2018-03-12 DIAGNOSIS — M47816 Spondylosis without myelopathy or radiculopathy, lumbar region: Secondary | ICD-10-CM | POA: Diagnosis not present

## 2018-03-12 DIAGNOSIS — M545 Low back pain: Secondary | ICD-10-CM | POA: Diagnosis not present

## 2018-03-15 DIAGNOSIS — H524 Presbyopia: Secondary | ICD-10-CM | POA: Diagnosis not present

## 2018-03-22 DIAGNOSIS — H40023 Open angle with borderline findings, high risk, bilateral: Secondary | ICD-10-CM | POA: Diagnosis not present

## 2018-04-01 MED FILL — TIMOLOL 0.5% EYE DROPS: 0.5 | 50 days supply | Qty: 5 | Fill #0

## 2018-04-02 MED FILL — AMOXICILLIN 500 MG CAPSULE: 500 | 2 days supply | Qty: 8 | Fill #0

## 2018-04-11 DIAGNOSIS — E039 Hypothyroidism, unspecified: Secondary | ICD-10-CM | POA: Diagnosis not present

## 2018-04-11 DIAGNOSIS — R0683 Snoring: Secondary | ICD-10-CM | POA: Diagnosis not present

## 2018-04-11 DIAGNOSIS — E785 Hyperlipidemia, unspecified: Secondary | ICD-10-CM | POA: Diagnosis not present

## 2018-04-11 MED FILL — NP THYROID 60 MG TABLET: 60 | 30 days supply | Qty: 30 | Fill #0

## 2018-05-09 MED FILL — BRIMONIDINE TARTRATE 0.15%: 0.15 | 25 days supply | Qty: 5 | Fill #0

## 2018-05-13 DIAGNOSIS — G441 Vascular headache, not elsewhere classified: Secondary | ICD-10-CM | POA: Diagnosis not present

## 2018-05-13 DIAGNOSIS — H9209 Otalgia, unspecified ear: Secondary | ICD-10-CM | POA: Diagnosis not present

## 2018-05-13 MED FILL — predniSONE 20 MG TABS: 20 | 5 days supply | Qty: 10 | Fill #0

## 2018-05-22 DIAGNOSIS — M47816 Spondylosis without myelopathy or radiculopathy, lumbar region: Secondary | ICD-10-CM | POA: Diagnosis not present

## 2018-05-22 DIAGNOSIS — M545 Low back pain: Secondary | ICD-10-CM | POA: Diagnosis not present

## 2018-05-28 ENCOUNTER — Encounter: Payer: Self-pay | Admitting: Internal Medicine

## 2018-06-03 MED FILL — BRIMONIDINE TARTRATE 0.15%: 0.15 | 25 days supply | Qty: 5 | Fill #1 | Status: TO

## 2018-06-04 DIAGNOSIS — G4730 Sleep apnea, unspecified: Secondary | ICD-10-CM | POA: Diagnosis not present

## 2018-06-04 MED FILL — NP THYROID 60 MG TABLET: 60 | 30 days supply | Qty: 30 | Fill #1

## 2018-06-17 DIAGNOSIS — H401132 Primary open-angle glaucoma, bilateral, moderate stage: Secondary | ICD-10-CM | POA: Diagnosis not present

## 2018-06-18 DIAGNOSIS — M48061 Spinal stenosis, lumbar region without neurogenic claudication: Secondary | ICD-10-CM | POA: Diagnosis not present

## 2018-06-18 DIAGNOSIS — M47816 Spondylosis without myelopathy or radiculopathy, lumbar region: Secondary | ICD-10-CM | POA: Diagnosis not present

## 2018-06-18 DIAGNOSIS — M545 Low back pain: Secondary | ICD-10-CM | POA: Diagnosis not present

## 2018-06-20 MED FILL — DICLOFENAC SOD EC 75 MG TAB: 75 | 30 days supply | Qty: 60 | Fill #0

## 2018-06-25 MED FILL — BRIMONIDINE TARTRATE 0.15%: 0.15 | 25 days supply | Qty: 5 | Fill #0

## 2018-06-26 DIAGNOSIS — R310 Gross hematuria: Secondary | ICD-10-CM | POA: Diagnosis not present

## 2018-06-26 DIAGNOSIS — G4733 Obstructive sleep apnea (adult) (pediatric): Secondary | ICD-10-CM | POA: Diagnosis not present

## 2018-06-26 DIAGNOSIS — E039 Hypothyroidism, unspecified: Secondary | ICD-10-CM | POA: Diagnosis not present

## 2018-07-15 MED FILL — NP THYROID 60 MG TABLET: 60 | 30 days supply | Qty: 30 | Fill #2

## 2018-07-15 MED FILL — BRIMONIDINE TARTRATE 0.15%: 0.15 | 25 days supply | Qty: 5 | Fill #1

## 2018-07-18 ENCOUNTER — Encounter: Payer: Self-pay | Admitting: Pulmonary Disease

## 2018-07-18 ENCOUNTER — Ambulatory Visit (INDEPENDENT_AMBULATORY_CARE_PROVIDER_SITE_OTHER): Payer: BLUE CROSS/BLUE SHIELD | Admitting: Pulmonary Disease

## 2018-07-18 VITALS — BP 124/80 | HR 86 | Ht 70.25 in | Wt 212.2 lb

## 2018-07-18 DIAGNOSIS — G4733 Obstructive sleep apnea (adult) (pediatric): Secondary | ICD-10-CM | POA: Insufficient documentation

## 2018-07-18 NOTE — Progress Notes (Signed)
Subjective:    Patient ID: Carl Hogan, male    DOB: 1956-12-01, 62 y.o.   MRN: [id number]  HPI  Chief Complaint  Patient presents with  . sleep consult    Referred by Dr. Anastasio Champion. Pt states he does have times where he gasps for breath in the middle of the night and has been told that he snores at night. Pt states when he wakes up in the morning, he feels like he has not slept at all. Pt states he has had a split night study performed.     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 He reports excessive daytime tiredness and frequent nocturnal awakenings.  His wife reports loud snoring and gasping episodes in his sleep. 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  But he still reports excessive daytime tiredness. Epworth sleepiness score is 16 and he reports sleepiness while sitting and reading, watching TV, sitting inactive in a public place or lying down to rest in the afternoons. He will often take naps for about 10 minutes which are refreshing.  Sometimes he laid down on the floor and nap.  He admits to drinking 12 to 16 cups of coffee per day which he attributes to his [place] ancestry. Bedtime is between 10-11 PM, sleep latency is half to 1 hour, he sleeps on his back with one pillow, reports 4-5 nocturnal awakenings including nocturia and is out of bed latest by 6 AM feeling tired without dryness of mouth or headaches. Occasionally he will wake up earlier and toss and turn and not be able to go back to sleep. There is no history suggestive of cataplexy, sleep paralysis or parasomnias  Home sleep test 05/28/2018 [second night] showed RDI of 18/hour with oxygen desaturation index of 23/hour, predominant hypopneas, lowest  desaturation of 83%.      Past Medical History:  Diagnosis Date  . Anemia   . Arthritis   . Bulge of cervical disc without myelopathy    C3-4 per mri  . Complication of anesthesia    was in recovery had Morphine and has anaphylactic reaction 2002. Also slow to wake up  . ED (erectile dysfunction)   . Hiatal hernia   . Hyperlipidemia   . Hypogonadism in male   . Hypothyroidism   . Insomnia   . Rectal mass   . Wears contact lenses     Past Surgical History:  Procedure Laterality Date  . CARDIOVASCULAR STRESS TEST  11-23-2016  dr Claiborne Billings   normal perfusion nuclear study w/ no ischemia/  stress ef 54% (lvef 45-54%) normal wall motion  . COLONOSCOPY WITH PROPOFOL N/A 07/11/2016   Procedure: COLONOSCOPY WITH PROPOFOL;  Surgeon: Wonda Horner, MD;  Location: [facility] ENDOSCOPY;  Service: Endoscopy;  Laterality: N/A;  . ESOPHAGOGASTRODUODENOSCOPY (EGD) WITH PROPOFOL N/A 07/11/2016   Procedure: ESOPHAGOGASTRODUODENOSCOPY (EGD) WITH PROPOFOL;  Surgeon: Wonda Horner, MD;  Location: [facility] ENDOSCOPY;  Service: Endoscopy;  Laterality: N/A;  . EVALUATION UNDER ANESTHESIA WITH HEMORRHOIDECTOMY N/A 11/29/2016   Procedure: EXAM UNDER ANESTHESIA;  Surgeon: Michael Boston, MD;  Location: [facility];  Service: General;  Laterality: N/A;  . MASS EXCISION N/A 11/29/2016   Procedure: EXCISION ANAL CANAL MASS;  Surgeon: Michael Boston, MD;  Location: [facility];  Service: General;  Laterality: N/A;  . TONSILLECTOMY  child  . TOTAL HIP ARTHROPLASTY Bilateral 2002  and 2007    Allergies  Allergen Reactions  . Morphine And Related Anaphylaxis    "In recovery room 2002 w/ total hip surgery"  . Lactose Intolerance (Gi) Other (See Comments)    Gi upset    Social History   Socioeconomic History  . Marital status: Married    Spouse name: Not on file  . Number of children: Not on file  . Years of education: Not on file  . Highest education level: Not on file  Occupational History    . Not on file  Social Needs  . Financial resource strain: Not on file  . Food insecurity:    Worry: Not on file    Inability: Not on file  . Transportation needs:    Medical: Not on file    Non-medical: Not on file  Tobacco Use  . Smoking status: Never Smoker  . Smokeless tobacco: Never Used  Substance and Sexual Activity  . Alcohol use: Yes    Alcohol/week: 3.0 - 5.0 standard drinks    Types: 3 - 5 Glasses of wine per week  . Drug use: No  . Sexual activity: Not on file  Lifestyle  . Physical activity:    Days per week: Not on file    Minutes per session: Not on file  . Stress: Not on file  Relationships  . Social connections:    Talks on phone: Not on file    Gets together: Not on file    Attends religious service: Not on file    Active member of club or organization: Not on file    Attends meetings of clubs or organizations: Not on file    Relationship status: Not on file  . Intimate partner violence:    Fear of current or ex partner: Not on file    Emotionally abused: Not on file    Physically abused: Not on file    Forced sexual activity: Not on file  Other Topics Concern  . Not on file  Social History Narrative  . Not on file      Family History  Problem Relation Age of Onset  . Edema Mother   . Heart disease Father      Review of Systems Positive for acid heartburn, 30 pound weight gain over the last 2 years, sore throat, headaches, sneezing, joint stiffness  Constitutional: negative for anorexia, fevers and sweats  Eyes: negative for irritation, redness and visual disturbance  Ears, nose, mouth, throat, and face: negative for earaches, epistaxis, nasal congestion  Respiratory: negative for cough, dyspnea on exertion, sputum and wheezing  Cardiovascular: negative for chest pain, dyspnea, lower extremity edema, orthopnea, palpitations and syncope  Gastrointestinal: negative for abdominal pain, constipation, diarrhea, melena, nausea and vomiting   Genitourinary:negative for dysuria, frequency and hematuria  Hematologic/lymphatic: negative for bleeding, easy bruising and lymphadenopathy  Musculoskeletal:negative for arthralgias, muscle weakness Neurological: negative for coordination problems, gait problems, headaches and weakness  Endocrine: negative for diabetic symptoms including polydipsia, polyuria and weight loss     Objective:   Physical Exam  Gen. Pleasant, well-nourished, in no distress, normal affect ENT - mild overbite, no post nasal drip Neck: No JVD, no thyromegaly, no carotid bruits Lungs: no use of accessory muscles, no dullness to percussion, clear without rales or rhonchi  Cardiovascular: Rhythm regular, heart sounds  normal, no murmurs or  gallops, no peripheral edema Abdomen: soft and non-tender, no hepatosplenomegaly, BS normal. Musculoskeletal: No deformities, no cyanosis or clubbing Neuro:  alert, non focal       Assessment & Plan:

## 2018-07-18 NOTE — Patient Instructions (Signed)
You have moderate degree of obstructive sleep apnea  Prescription for auto CPAP 5 to 15 cm, nasal mask, humidity Will be sent to DME

## 2018-07-18 NOTE — Assessment & Plan Note (Signed)
He does seem to have moderate OSA  The pathophysiology of obstructive sleep apnea , it's cardiovascular consequences & modes of treatment including CPAP were discused with the patient in detail & they evidenced understanding.  We will initiate auto CPAP 5 to 15 cm with nasal mask and humidity.  We will assess download in 2 weeks settings as required in 1 month.  This will be a therapeutic trial to see if he has significant improvement in his daytime somnolence and fatigue

## 2018-07-23 ENCOUNTER — Telehealth: Payer: Self-pay | Admitting: Pulmonary Disease

## 2018-07-23 NOTE — Telephone Encounter (Signed)
Spoke with Janette at [facility]. Advised her that the patient came in to see RA on 07/18/18 as a sleep consult. Per the office note, patient had a SS that done back in June/July 2019 but I could not locate the study. Gave her the name of the patient's PCP who referred patient to RA. She verbalized understanding.   Nothing further needed at time of call.

## 2018-07-29 DIAGNOSIS — H401132 Primary open-angle glaucoma, bilateral, moderate stage: Secondary | ICD-10-CM | POA: Diagnosis not present

## 2018-08-01 DIAGNOSIS — G4733 Obstructive sleep apnea (adult) (pediatric): Secondary | ICD-10-CM | POA: Diagnosis not present

## 2018-08-03 MED FILL — BRIMONIDINE TARTRATE 0.15%: 0.15 | 25 days supply | Qty: 5 | Fill #2

## 2018-08-23 DIAGNOSIS — M47816 Spondylosis without myelopathy or radiculopathy, lumbar region: Secondary | ICD-10-CM | POA: Diagnosis not present

## 2018-08-23 DIAGNOSIS — M48061 Spinal stenosis, lumbar region without neurogenic claudication: Secondary | ICD-10-CM | POA: Diagnosis not present

## 2018-08-23 DIAGNOSIS — M545 Low back pain: Secondary | ICD-10-CM | POA: Diagnosis not present

## 2018-08-26 DIAGNOSIS — M19011 Primary osteoarthritis, right shoulder: Secondary | ICD-10-CM | POA: Diagnosis not present

## 2018-08-26 DIAGNOSIS — N401 Enlarged prostate with lower urinary tract symptoms: Secondary | ICD-10-CM | POA: Diagnosis not present

## 2018-08-26 DIAGNOSIS — R350 Frequency of micturition: Secondary | ICD-10-CM | POA: Diagnosis not present

## 2018-08-26 DIAGNOSIS — R310 Gross hematuria: Secondary | ICD-10-CM | POA: Diagnosis not present

## 2018-09-01 DIAGNOSIS — G4733 Obstructive sleep apnea (adult) (pediatric): Secondary | ICD-10-CM | POA: Diagnosis not present

## 2018-09-02 ENCOUNTER — Ambulatory Visit: Payer: BLUE CROSS/BLUE SHIELD | Admitting: Pulmonary Disease

## 2018-09-06 DIAGNOSIS — M19011 Primary osteoarthritis, right shoulder: Secondary | ICD-10-CM | POA: Diagnosis not present

## 2018-10-01 DIAGNOSIS — G4733 Obstructive sleep apnea (adult) (pediatric): Secondary | ICD-10-CM | POA: Diagnosis not present

## 2018-10-07 DIAGNOSIS — R350 Frequency of micturition: Secondary | ICD-10-CM | POA: Diagnosis not present

## 2018-10-07 DIAGNOSIS — N401 Enlarged prostate with lower urinary tract symptoms: Secondary | ICD-10-CM | POA: Diagnosis not present

## 2018-10-09 DIAGNOSIS — J209 Acute bronchitis, unspecified: Secondary | ICD-10-CM | POA: Diagnosis not present

## 2018-10-09 DIAGNOSIS — R05 Cough: Secondary | ICD-10-CM | POA: Diagnosis not present

## 2018-10-09 MED FILL — predniSONE 20 MG TABS: 20 | 5 days supply | Qty: 10 | Fill #0

## 2018-10-09 MED FILL — NP THYROID 60 MG TABLET: 60 | 90 days supply | Qty: 90 | Fill #0

## 2018-10-09 MED FILL — ZOLPIDEM TARTRATE 5 MG TAB: 5 | 30 days supply | Qty: 15 | Fill #0

## 2018-10-09 MED FILL — AZITHROMYCIN 250 MG TABLET: 250 | 5 days supply | Qty: 6 | Fill #0

## 2018-10-17 MED FILL — AMOX-CLAV 500-125 MG TABLET: 500-125 | 7 days supply | Qty: 14 | Fill #0

## 2018-11-01 DIAGNOSIS — G4733 Obstructive sleep apnea (adult) (pediatric): Secondary | ICD-10-CM | POA: Diagnosis not present

## 2018-11-06 ENCOUNTER — Other Ambulatory Visit (HOSPITAL_COMMUNITY): Payer: Self-pay | Admitting: Internal Medicine

## 2018-11-06 ENCOUNTER — Ambulatory Visit (HOSPITAL_COMMUNITY)
Admission: RE | Admit: 2018-11-06 | Discharge: 2018-11-06 | Disposition: A | Discharge status: Home / Self Care | Payer: BLUE CROSS/BLUE SHIELD | Source: Ambulatory Visit | Attending: Internal Medicine | Admitting: Internal Medicine

## 2018-11-06 DIAGNOSIS — R05 Cough: Secondary | ICD-10-CM | POA: Diagnosis not present

## 2018-11-06 DIAGNOSIS — R0602 Shortness of breath: Secondary | ICD-10-CM | POA: Diagnosis not present

## 2018-11-06 DIAGNOSIS — R059 Cough, unspecified: Secondary | ICD-10-CM

## 2018-11-06 MED FILL — MONTELUKAST SOD 10 MG TAB: 10 | 30 days supply | Qty: 30 | Fill #0

## 2018-11-21 ENCOUNTER — Ambulatory Visit: Payer: BLUE CROSS/BLUE SHIELD | Admitting: Pulmonary Disease

## 2018-11-21 ENCOUNTER — Encounter: Payer: Self-pay | Admitting: Pulmonary Disease

## 2018-11-21 DIAGNOSIS — R05 Cough: Secondary | ICD-10-CM

## 2018-11-21 DIAGNOSIS — G4733 Obstructive sleep apnea (adult) (pediatric): Secondary | ICD-10-CM

## 2018-11-21 DIAGNOSIS — R053 Chronic cough: Secondary | ICD-10-CM

## 2018-11-21 MED ORDER — BENZONATATE 200 MG PO CAPS: 200.0000 mg | ORAL_CAPSULE | Freq: Three times a day (TID) | ORAL | 1 refills | Status: AC | PRN

## 2018-11-21 MED FILL — BENZONATATE 200 MG CAP: 200 | 20 days supply | Qty: 60 | Fill #0

## 2018-11-21 NOTE — Assessment & Plan Note (Signed)
Likely post bronchitic, no bronchospasm today so does not need more steroids Singulair has not helped and this can be stopped, doubt allergic No evidence of postnasal drip or GERD that is triggering -does not have clear dysphagia and swallowing problems may be related to throat irritation  Trial of [name] cough syrup 5 mL 3 times daily X 2 weeks  Benzonatate 200 mg thrice daily as needed for cough # 60  Call me if cough no better in 5 weeks or if swallowing problem persists.

## 2018-11-21 NOTE — Addendum Note (Signed)
Addended by: Valerie Salts on: 11/21/2018 03:48 PM   Modules accepted: Orders

## 2018-11-21 NOTE — Patient Instructions (Signed)
Trial of [name] cough syrup 5 mL 3 times daily X 2 weeks  Benzonatate 200 mg thrice daily as needed for cough # 60  Call me if cough no better in 5 weeks or if swallowing problem persists.  Trial of nasal pillows with CPAP Use chinstrap if needed if you develop a mouth leak.  If CPAP option does not work, then we can use dental appliance for sleep apnea

## 2018-11-21 NOTE — Progress Notes (Signed)
   Subjective:    Patient ID: Carl Hogan, male    DOB: 1956-09-09, 62 y.o.   MRN: [id number]  HPI  62 year old for follow-up of OSA and has a new complaint of chronic cough  Chief Complaint  Patient presents with  . Follow-up    Per patient, he has had a chronic cough for the past 2-3 months. Patient tends to cough more at night. Nonproductive cough. Increased wheezing. Difficulty in swallowing at times.    We reviewed his home sleep study which showed moderate OSA 18/hour.  He was started on auto CPAP, he initially tried a full facemask but has not been really been able to use this much because of feeling of suffocation.  He has not tried alternative masks.  His wife feels that he may be a mouth breather.  He also wants to know about other options.  Meanwhile he developed a URI in 09/2018 which then progressed to a chest cold.  He was initially given Z-Pak and then a course of oral steroids.  He was also given Augmentin later.  When the cough persisted he was placed on Singulair over the past 2 weeks and he almost feels like his cough is worse on this. Overall his cough is about 50% improved from when it started .  He reports a deep throat and cough no diurnal variation and especially worse when he starts conversations.  He has tried DayQuil NyQuil over-the-counter and also use some bourbon at night  Chest x-ray 12/5 was reviewed which does not show any infiltrates  He denies obvious postnasal drip or reflux symptoms, he is a lifetime never smoker   Significant tests/ events reviewed  Home sleep test 05/28/2018 [second night] showed RDI of 18/hour with oxygen desaturation index of 23/hour, predominant hypopneas, lowest desaturation of 83%.  Past Medical History:  Diagnosis Date  . Anemia   . Arthritis   . Bulge of cervical disc without myelopathy    C3-4 per mri  . Complication of anesthesia    was in recovery had Morphine and has anaphylactic reaction 2002. Also slow to wake  up  . ED (erectile dysfunction)   . Hiatal hernia   . Hyperlipidemia   . Hypogonadism in male   . Hypothyroidism   . Insomnia   . Rectal mass   . Wears contact lenses      Review of Systems neg for any significant sore throat, dysphagia, itching, sneezing, nasal congestion or excess/ purulent secretions, fever, chills, sweats, unintended wt loss, pleuritic or exertional cp, hempoptysis, orthopnea pnd or change in chronic leg swelling. Also denies presyncope, palpitations, heartburn, abdominal pain, nausea, vomiting, diarrhea or change in bowel or urinary habits, dysuria,hematuria, rash, arthralgias, visual complaints, headache, numbness weakness or ataxia.     Objective:   Physical Exam   Gen. Pleasant, well-nourished, in no distress, normal affect ENT - no pallor,icterus, no post nasal drip Neck: No JVD, no thyromegaly, no carotid bruits Lungs: no use of accessory muscles, no dullness to percussion, clear without rales or rhonchi  Cardiovascular: Rhythm regular, heart sounds  normal, no murmurs or gallops, no peripheral edema Abdomen: soft and non-tender, no hepatosplenomegaly, BS normal. Musculoskeletal: No deformities, no cyanosis or clubbing Neuro:  alert, non focal        Assessment & Plan:

## 2018-11-21 NOTE — Assessment & Plan Note (Signed)
  Trial of nasal pillows with CPAP Use chinstrap if needed if you develop a mouth leak.  If CPAP option does not work, then we can use dental appliance for sleep apnea

## 2018-11-25 ENCOUNTER — Ambulatory Visit: Payer: BLUE CROSS/BLUE SHIELD | Admitting: Pulmonary Disease

## 2018-12-01 DIAGNOSIS — G4733 Obstructive sleep apnea (adult) (pediatric): Secondary | ICD-10-CM | POA: Diagnosis not present

## 2018-12-03 MED FILL — MONTELUKAST SOD 10 MG TAB: 10 | 30 days supply | Qty: 30 | Fill #1

## 2018-12-03 MED FILL — ZOLPIDEM TARTRATE 5 MG TAB: 5 | 30 days supply | Qty: 15 | Fill #1

## 2018-12-06 ENCOUNTER — Encounter: Payer: Self-pay | Admitting: Nurse Practitioner

## 2018-12-06 ENCOUNTER — Ambulatory Visit (INDEPENDENT_AMBULATORY_CARE_PROVIDER_SITE_OTHER)
Admission: RE | Admit: 2018-12-06 | Discharge: 2018-12-06 | Disposition: A | Discharge status: Home / Self Care | Payer: BLUE CROSS/BLUE SHIELD | Source: Ambulatory Visit | Attending: Primary Care | Admitting: Primary Care

## 2018-12-06 ENCOUNTER — Ambulatory Visit: Payer: BLUE CROSS/BLUE SHIELD | Admitting: Nurse Practitioner

## 2018-12-06 VITALS — BP 136/94 | HR 87 | Temp 98.2°F | Ht 70.25 in | Wt 219.0 lb

## 2018-12-06 DIAGNOSIS — R05 Cough: Secondary | ICD-10-CM | POA: Diagnosis not present

## 2018-12-06 DIAGNOSIS — R053 Chronic cough: Secondary | ICD-10-CM

## 2018-12-06 MED ORDER — PSEUDOEPH-BROMPHEN-DM 30-2-10 MG/5ML PO SYRP: 5.0000 mL | ORAL_SOLUTION | Freq: Three times a day (TID) | ORAL | 0 refills | Status: AC | PRN

## 2018-12-06 MED ORDER — PREDNISONE 10 MG PO TABS: ORAL_TABLET | ORAL | 0 refills | Status: DC

## 2018-12-06 MED FILL — predniSONE 10 MG TABS: 10 | 6 days supply | Qty: 12 | Fill #0

## 2018-12-06 MED FILL — BROMPHENIR-PSEUDOEPHED-DM S: 30-2-10 | 8 days supply | Qty: 120 | Fill #0

## 2018-12-06 NOTE — Patient Instructions (Addendum)
Will order chest x ray and call with results Samples of mucinex given Prednisone taper ordered brompheniramine DM for cough ordered May start zyrtec Follow up with Dr. Elsworth Soho at his 1st availble or sooner if needed  Cough: Start zyrtec and mucinex You need to try to suppress your cough to allow your larynx (voice box) to heal.  For three days don't talk, laugh, sing, or clear your throat. Do everything you can to suppress the cough during this time. Use hard candies (sugarless Jolly Ranchers) or non-mint or non-menthol containing cough drops during this time to soothe your throat.  Use a cough suppressant ([name] or what I have prescribed you) around the clock during this time.  After three days, gradually increase the use of your voice and back off on the cough suppressants.

## 2018-12-06 NOTE — Assessment & Plan Note (Signed)
Patient Instructions  Will order chest x ray and call with results Samples of mucinex given Prednisone taper ordered brompheniramine DM for cough ordered May start zyrtec Follow up with Dr. Elsworth Soho at his 1st availble or sooner if needed  Cough: Start zyrtec and mucinex You need to try to suppress your cough to allow your larynx (voice box) to heal.  For three days don't talk, laugh, sing, or clear your throat. Do everything you can to suppress the cough during this time. Use hard candies (sugarless Jolly Ranchers) or non-mint or non-menthol containing cough drops during this time to soothe your throat.  Use a cough suppressant ([name] or what I have prescribed you) around the clock during this time.  After three days, gradually increase the use of your voice and back off on the cough suppressants.

## 2018-12-06 NOTE — Progress Notes (Signed)
@Patient  ID: Carl Hogan, male    DOB: [birth year], 63 y.o.   MRN: [id number]  Chief Complaint  Patient presents with  . Cough    with congestion    Referring provider: [name], MD  HPI 62 year old male never smoker with OSA followed by Dr. Elsworth Hogan.  Tests: CXR 11/06/18>> No active cardiopulmonary disease  OV 12/06/18 - chronic cough Patient presents today for a chronic cough.  He was seen by his primary care physician on around 11/07/2018 and was treated for bronchitis with azithromycin.  His chest x-ray was clear at that visit.  He was last seen by Dr. Elsworth Hogan 11/21/2018.  He was given Ladona Ridgel and told to take [name] as needed for post bronchitic cough.  He states that the cough continues.  He states that it has not improved.  The cough is nonproductive.  Patient states that he does have some sinus congestion postnasal drip associated with cough.  He denies any recent fever, shortness of breath, chest pain, or edema.  He states that he is very tired because he is not sleeping good at night due to cough.    Allergies  Allergen Reactions  . Morphine And Related Anaphylaxis    "In recovery room 2002 w/ total hip surgery"  . Lactose Intolerance (Gi) Other (See Comments)    Gi upset    Immunization History  Administered Date(s) Administered  . Influenza Split 08/04/2010, 10/28/2014  . Influenza,inj,Quad PF,6+ Mos 09/03/2017  . Pneumococcal Polysaccharide-23 08/04/2010  . Tdap 08/04/2008    Past Medical History:  Diagnosis Date  . Anemia   . Arthritis   . Bulge of cervical disc without myelopathy    C3-4 per mri  . Complication of anesthesia    was in recovery had Morphine and has anaphylactic reaction 2002. Also slow to wake up  . ED (erectile dysfunction)   . Hiatal hernia   . Hyperlipidemia   . Hypogonadism in male   . Hypothyroidism   . Insomnia   . Rectal mass   . Wears contact lenses     Tobacco History: Social History   Tobacco Use    Smoking Status Never Smoker  Smokeless Tobacco Never Used   Counseling given: Not Answered   Outpatient Encounter Medications as of 12/06/2018  Medication Sig  . aspirin 81 MG tablet Take 81 mg by mouth daily.  . benzonatate (TESSALON) 200 MG capsule Take 1 capsule (200 mg total) by mouth 3 (three) times daily as needed for cough.  . Cholecalciferol (VITAMIN D3) 10000 units capsule Take 1 tablet by mouth every morning.   Marland Kitchen levothyroxine (SYNTHROID, LEVOTHROID) 25 MCG tablet Take 25 mcg by mouth daily before breakfast.  . montelukast (SINGULAIR) 10 MG tablet Take 10 mg by mouth at bedtime.  . Zinc Sulfate (ZINC 15 PO) Take 1 tablet by mouth daily.  Marland Kitchen zolpidem (AMBIEN) 5 MG tablet Take 5 mg by mouth at bedtime as needed for sleep.  . brompheniramine-pseudoephedrine-DM 30-2-10 MG/5ML syrup Take 5 mLs by mouth 3 (three) times daily as needed for up to 5 days.  . predniSONE (DELTASONE) 10 MG tablet Take 3 tabs for 2 days, then 2 tabs for 2 days, then 1 tab for 2 days, then stop   No facility-administered encounter medications on file as of 12/06/2018.      Review of Systems  Review of Systems  Constitutional: Negative.  Negative for chills and fever.  HENT: Positive for postnasal drip and sinus pressure. Negative for  congestion.   Respiratory: Positive for cough. Negative for shortness of breath and wheezing.   Cardiovascular: Negative.  Negative for chest pain, palpitations and leg swelling.  Gastrointestinal: Negative.   Allergic/Immunologic: Negative.   Neurological: Negative.   Psychiatric/Behavioral: Negative.        Physical Exam  BP (!) 136/94 (BP Location: Right Arm, Patient Position: Sitting, Cuff Size: Normal)   Pulse 87   Temp 98.2 F (36.8 C)   Ht 5' 10.25" (1.784 m)   Wt 219 lb (99.3 kg)   SpO2 96%   BMI 31.20 kg/m   Wt Readings from Last 5 Encounters:  12/06/18 219 lb (99.3 kg)  11/21/18 215 lb (97.5 kg)  07/18/18 212 lb 3.2 oz (96.3 kg)  11/29/16 204 lb  (92.5 kg)  11/23/16 206 lb (93.4 kg)     Physical Exam Vitals signs and nursing note reviewed.  Constitutional:      General: He is not in acute distress.    Appearance: He is well-developed.  Cardiovascular:     Rate and Rhythm: Normal rate and regular rhythm.  Pulmonary:     Effort: Pulmonary effort is normal. No respiratory distress.     Breath sounds: No wheezing or rhonchi.  Musculoskeletal:        General: No swelling.  Skin:    General: Skin is warm and dry.  Neurological:     Mental Status: He is alert and oriented to person, place, and time.      Imaging: Dg Chest 2 View  Result Date: 12/06/2018 CLINICAL DATA:  Cough for several months EXAM: CHEST - 2 VIEW COMPARISON:  11/06/2018 FINDINGS: Cardiac shadows within normal limits. The lungs are well aerated bilaterally. No focal infiltrate or sizable effusion is seen. No acute bony abnormality is noted. IMPRESSION: No active cardiopulmonary disease. Electronically Signed   By: [name] M.D.   On: 12/06/2018 10:10   Dg Chest 2 View  Result Date: 11/07/2018 CLINICAL DATA:  Shortness of breath and cough EXAM: CHEST - 2 VIEW COMPARISON:  11/20/17 FINDINGS: The heart size and mediastinal contours are within normal limits. Both lungs are clear. The visualized skeletal structures show degenerative change of thoracic spine. IMPRESSION: No active cardiopulmonary disease. Electronically Signed   By: [name] M.D.   On: 11/07/2018 10:46     Assessment & Plan:   Chronic cough Patient Instructions  Will order chest x ray and call with results Samples of mucinex given Prednisone taper ordered brompheniramine DM for cough ordered May start zyrtec Follow up with Dr. Elsworth Hogan at his 1st availble or sooner if needed  Cough: Start zyrtec and mucinex You need to try to suppress your cough to allow your larynx (voice box) to heal.  For three days don't talk, laugh, sing, or clear your throat. Do everything you can to suppress the  cough during this time. Use hard candies (sugarless Jolly Ranchers) or non-mint or non-menthol containing cough drops during this time to soothe your throat.  Use a cough suppressant ([name] or what I have prescribed you) around the clock during this time.  After three days, gradually increase the use of your voice and back off on the cough suppressants.         Fenton Foy, NP 12/06/2018

## 2018-12-09 DIAGNOSIS — Z125 Encounter for screening for malignant neoplasm of prostate: Secondary | ICD-10-CM | POA: Diagnosis not present

## 2018-12-09 DIAGNOSIS — N529 Male erectile dysfunction, unspecified: Secondary | ICD-10-CM | POA: Diagnosis not present

## 2018-12-09 DIAGNOSIS — E559 Vitamin D deficiency, unspecified: Secondary | ICD-10-CM | POA: Diagnosis not present

## 2018-12-09 DIAGNOSIS — Z0000 Encounter for general adult medical examination without abnormal findings: Secondary | ICD-10-CM | POA: Diagnosis not present

## 2018-12-09 DIAGNOSIS — E039 Hypothyroidism, unspecified: Secondary | ICD-10-CM | POA: Diagnosis not present

## 2018-12-09 DIAGNOSIS — R5383 Other fatigue: Secondary | ICD-10-CM | POA: Diagnosis not present

## 2018-12-09 DIAGNOSIS — G4733 Obstructive sleep apnea (adult) (pediatric): Secondary | ICD-10-CM | POA: Diagnosis not present

## 2018-12-09 DIAGNOSIS — E785 Hyperlipidemia, unspecified: Secondary | ICD-10-CM | POA: Diagnosis not present

## 2018-12-10 DIAGNOSIS — H401132 Primary open-angle glaucoma, bilateral, moderate stage: Secondary | ICD-10-CM | POA: Diagnosis not present

## 2018-12-17 ENCOUNTER — Ambulatory Visit: Payer: BLUE CROSS/BLUE SHIELD | Admitting: Pulmonary Disease

## 2018-12-18 ENCOUNTER — Ambulatory Visit: Payer: BLUE CROSS/BLUE SHIELD | Admitting: Pulmonary Disease

## 2018-12-18 ENCOUNTER — Encounter: Payer: Self-pay | Admitting: Pulmonary Disease

## 2018-12-18 DIAGNOSIS — R05 Cough: Secondary | ICD-10-CM

## 2018-12-18 DIAGNOSIS — G4733 Obstructive sleep apnea (adult) (pediatric): Secondary | ICD-10-CM

## 2018-12-18 DIAGNOSIS — R053 Chronic cough: Secondary | ICD-10-CM

## 2018-12-18 NOTE — Patient Instructions (Signed)
Cough seems to have been post bronchitic.  Trial of nasal pillows with CPAP, call me back to report in 1 month.  If you are unable to tolerate we will explore possibility of dental appliance, check with your dentist if he makes these

## 2018-12-18 NOTE — Assessment & Plan Note (Signed)
Trial of nasal pillows with CPAP, call me back to report in 1 month. Auto settings  If you are unable to tolerate we will explore possibility of dental appliance, check with your dentist if he makes these

## 2018-12-18 NOTE — Progress Notes (Signed)
   Subjective:    Patient ID: Carl Hogan, male    DOB: 09/16/1956, 63 y.o.   MRN: [id number]  HPI  63 year old for follow-up of OSA and chronic cough  Chief Complaint  Patient presents with  . Follow-up    f/u for cough. States that his cough stopped about 4 days ago. Increased SOB   He developed a chronic cough after lower respiratory infection 09/2018 which seemed to persist, improved slightly after trial of prednisone.  He was seen again 12/2018 and given another trial of prednisone.  Cough is finally resolved after 4 months. He was unable to use his CPAP as much while he had this cough.   He was unable to tolerate nasal or full facemask and our plan was to trial nasal pillows.  He continues to have sleep related issues and feels poorly rested on waking up   Significant tests/ events reviewed  Home sleep test 05/28/2018 [second night] showed RDI of 18/hour with oxygen desaturation index of 23/hour, predominant hypopneas, lowest desaturation of 83%.  Review of Systems neg for any significant sore throat, dysphagia, itching, sneezing, nasal congestion or excess/ purulent secretions, fever, chills, sweats, unintended wt loss, pleuritic or exertional cp, hempoptysis, orthopnea pnd or change in chronic leg swelling. Also denies presyncope, palpitations, heartburn, abdominal pain, nausea, vomiting, diarrhea or change in bowel or urinary habits, dysuria,hematuria, rash, arthralgias, visual complaints, headache, numbness weakness or ataxia.     Objective:   Physical Exam  Gen. Pleasant, well-nourished, in no distress ENT - no thrush, no pallor/icterus,no post nasal drip, mild underbite Neck: No JVD, no thyromegaly, no carotid bruits Lungs: no use of accessory muscles, no dullness to percussion, clear without rales or rhonchi  Cardiovascular: Rhythm regular, heart sounds  normal, no murmurs or gallops, no peripheral edema Musculoskeletal: No deformities, no cyanosis or clubbing         Assessment & Plan:

## 2018-12-18 NOTE — Addendum Note (Signed)
Addended by: Valerie Salts on: 12/18/2018 12:28 PM   Modules accepted: Orders

## 2018-12-18 NOTE — Assessment & Plan Note (Signed)
Cough seems to have been post bronchitic-now resolved

## 2018-12-24 MED FILL — NP THYROID 90 MG TABLET: 90 | 30 days supply | Qty: 30 | Fill #0

## 2019-01-01 DIAGNOSIS — G4733 Obstructive sleep apnea (adult) (pediatric): Secondary | ICD-10-CM | POA: Diagnosis not present

## 2019-01-13 MED FILL — ZOLPIDEM TARTRATE 5 MG TAB: 5 | 30 days supply | Qty: 15 | Fill #0

## 2019-01-15 DIAGNOSIS — M19011 Primary osteoarthritis, right shoulder: Secondary | ICD-10-CM | POA: Diagnosis not present

## 2019-01-28 MED FILL — NP THYROID 90 MG TABLET: 90 | 30 days supply | Qty: 30 | Fill #1

## 2019-02-01 DIAGNOSIS — G4733 Obstructive sleep apnea (adult) (pediatric): Secondary | ICD-10-CM | POA: Diagnosis not present

## 2019-02-03 DIAGNOSIS — M47816 Spondylosis without myelopathy or radiculopathy, lumbar region: Secondary | ICD-10-CM | POA: Diagnosis not present

## 2019-02-03 DIAGNOSIS — M545 Low back pain: Secondary | ICD-10-CM | POA: Diagnosis not present

## 2019-02-03 DIAGNOSIS — M48061 Spinal stenosis, lumbar region without neurogenic claudication: Secondary | ICD-10-CM | POA: Diagnosis not present

## 2019-02-07 DIAGNOSIS — M47816 Spondylosis without myelopathy or radiculopathy, lumbar region: Secondary | ICD-10-CM | POA: Diagnosis not present

## 2019-02-07 DIAGNOSIS — M48061 Spinal stenosis, lumbar region without neurogenic claudication: Secondary | ICD-10-CM | POA: Diagnosis not present

## 2019-02-07 DIAGNOSIS — M545 Low back pain: Secondary | ICD-10-CM | POA: Diagnosis not present

## 2019-02-10 MED FILL — ZOLPIDEM TARTRATE 5 MG TAB: 5 | 30 days supply | Qty: 15 | Fill #1

## 2019-02-17 DIAGNOSIS — H401132 Primary open-angle glaucoma, bilateral, moderate stage: Secondary | ICD-10-CM | POA: Diagnosis not present

## 2019-02-26 ENCOUNTER — Ambulatory Visit: Payer: BLUE CROSS/BLUE SHIELD | Admitting: Pulmonary Disease

## 2019-02-27 ENCOUNTER — Encounter (INDEPENDENT_AMBULATORY_CARE_PROVIDER_SITE_OTHER): Payer: Self-pay | Admitting: Internal Medicine

## 2019-02-27 DIAGNOSIS — H401134 Primary open-angle glaucoma, bilateral, indeterminate stage: Secondary | ICD-10-CM | POA: Diagnosis not present

## 2019-03-02 DIAGNOSIS — G4733 Obstructive sleep apnea (adult) (pediatric): Secondary | ICD-10-CM | POA: Diagnosis not present

## 2019-03-03 DIAGNOSIS — E039 Hypothyroidism, unspecified: Secondary | ICD-10-CM | POA: Diagnosis not present

## 2019-03-03 DIAGNOSIS — E785 Hyperlipidemia, unspecified: Secondary | ICD-10-CM | POA: Diagnosis not present

## 2019-03-03 DIAGNOSIS — E291 Testicular hypofunction: Secondary | ICD-10-CM | POA: Diagnosis not present

## 2019-03-03 DIAGNOSIS — E559 Vitamin D deficiency, unspecified: Secondary | ICD-10-CM | POA: Diagnosis not present

## 2019-03-14 MED FILL — TESTOSTERONE CYP 200 MG/ML: 200 | 28 days supply | Qty: 8 | Fill #0

## 2019-03-14 MED FILL — NP THYROID 90 MG TABLET: 90 | 30 days supply | Qty: 30 | Fill #0

## 2019-03-14 MED FILL — ZOLPIDEM TARTRATE 5 MG TAB: 5 | 30 days supply | Qty: 15 | Fill #0

## 2019-04-02 DIAGNOSIS — G4733 Obstructive sleep apnea (adult) (pediatric): Secondary | ICD-10-CM | POA: Diagnosis not present

## 2019-04-09 DIAGNOSIS — H401132 Primary open-angle glaucoma, bilateral, moderate stage: Secondary | ICD-10-CM | POA: Diagnosis not present

## 2019-04-21 DIAGNOSIS — E039 Hypothyroidism, unspecified: Secondary | ICD-10-CM | POA: Diagnosis not present

## 2019-04-21 DIAGNOSIS — G4700 Insomnia, unspecified: Secondary | ICD-10-CM | POA: Diagnosis not present

## 2019-04-21 DIAGNOSIS — E291 Testicular hypofunction: Secondary | ICD-10-CM | POA: Diagnosis not present

## 2019-04-21 DIAGNOSIS — B369 Superficial mycosis, unspecified: Secondary | ICD-10-CM | POA: Diagnosis not present

## 2019-05-02 DIAGNOSIS — G4733 Obstructive sleep apnea (adult) (pediatric): Secondary | ICD-10-CM | POA: Diagnosis not present

## 2019-05-29 DIAGNOSIS — L309 Dermatitis, unspecified: Secondary | ICD-10-CM | POA: Diagnosis not present

## 2019-05-29 DIAGNOSIS — L820 Inflamed seborrheic keratosis: Secondary | ICD-10-CM | POA: Diagnosis not present

## 2019-05-29 DIAGNOSIS — L298 Other pruritus: Secondary | ICD-10-CM | POA: Diagnosis not present

## 2019-06-02 DIAGNOSIS — G4733 Obstructive sleep apnea (adult) (pediatric): Secondary | ICD-10-CM | POA: Diagnosis not present

## 2019-06-10 DIAGNOSIS — E559 Vitamin D deficiency, unspecified: Secondary | ICD-10-CM | POA: Diagnosis not present

## 2019-06-10 DIAGNOSIS — E039 Hypothyroidism, unspecified: Secondary | ICD-10-CM | POA: Diagnosis not present

## 2019-06-10 DIAGNOSIS — N529 Male erectile dysfunction, unspecified: Secondary | ICD-10-CM | POA: Diagnosis not present

## 2019-06-10 DIAGNOSIS — E785 Hyperlipidemia, unspecified: Secondary | ICD-10-CM | POA: Diagnosis not present

## 2019-06-19 DIAGNOSIS — M5416 Radiculopathy, lumbar region: Secondary | ICD-10-CM | POA: Diagnosis not present

## 2019-06-19 DIAGNOSIS — M47816 Spondylosis without myelopathy or radiculopathy, lumbar region: Secondary | ICD-10-CM | POA: Diagnosis not present

## 2019-06-26 DIAGNOSIS — Z8711 Personal history of peptic ulcer disease: Secondary | ICD-10-CM | POA: Diagnosis not present

## 2019-06-26 DIAGNOSIS — R3912 Poor urinary stream: Secondary | ICD-10-CM | POA: Diagnosis not present

## 2019-06-26 DIAGNOSIS — R5383 Other fatigue: Secondary | ICD-10-CM | POA: Diagnosis not present

## 2019-06-26 DIAGNOSIS — K56609 Unspecified intestinal obstruction, unspecified as to partial versus complete obstruction: Secondary | ICD-10-CM | POA: Diagnosis not present

## 2019-06-26 DIAGNOSIS — Z6832 Body mass index (BMI) 32.0-32.9, adult: Secondary | ICD-10-CM | POA: Diagnosis not present

## 2019-06-26 DIAGNOSIS — K388 Other specified diseases of appendix: Secondary | ICD-10-CM | POA: Diagnosis not present

## 2019-06-26 DIAGNOSIS — R1031 Right lower quadrant pain: Secondary | ICD-10-CM | POA: Diagnosis not present

## 2019-06-26 DIAGNOSIS — E669 Obesity, unspecified: Secondary | ICD-10-CM | POA: Diagnosis not present

## 2019-06-26 DIAGNOSIS — R42 Dizziness and giddiness: Secondary | ICD-10-CM | POA: Diagnosis not present

## 2019-06-26 DIAGNOSIS — Z20828 Contact with and (suspected) exposure to other viral communicable diseases: Secondary | ICD-10-CM | POA: Diagnosis not present

## 2019-06-26 DIAGNOSIS — K3533 Acute appendicitis with perforation and localized peritonitis, with abscess: Secondary | ICD-10-CM | POA: Diagnosis not present

## 2019-06-26 DIAGNOSIS — R109 Unspecified abdominal pain: Secondary | ICD-10-CM | POA: Diagnosis not present

## 2019-06-26 DIAGNOSIS — N5082 Scrotal pain: Secondary | ICD-10-CM | POA: Diagnosis not present

## 2019-06-26 DIAGNOSIS — K567 Ileus, unspecified: Secondary | ICD-10-CM | POA: Diagnosis not present

## 2019-06-26 DIAGNOSIS — K3580 Unspecified acute appendicitis: Secondary | ICD-10-CM | POA: Diagnosis not present

## 2019-07-02 DIAGNOSIS — G4733 Obstructive sleep apnea (adult) (pediatric): Secondary | ICD-10-CM | POA: Diagnosis not present

## 2019-08-02 DIAGNOSIS — G4733 Obstructive sleep apnea (adult) (pediatric): Secondary | ICD-10-CM | POA: Diagnosis not present

## 2019-08-08 DIAGNOSIS — R7989 Other specified abnormal findings of blood chemistry: Secondary | ICD-10-CM | POA: Diagnosis not present

## 2019-08-08 DIAGNOSIS — E039 Hypothyroidism, unspecified: Secondary | ICD-10-CM | POA: Diagnosis not present

## 2019-08-08 DIAGNOSIS — Z0000 Encounter for general adult medical examination without abnormal findings: Secondary | ICD-10-CM | POA: Diagnosis not present

## 2019-08-08 DIAGNOSIS — Z125 Encounter for screening for malignant neoplasm of prostate: Secondary | ICD-10-CM | POA: Diagnosis not present

## 2019-08-22 DIAGNOSIS — Z86010 Personal history of colonic polyps: Secondary | ICD-10-CM | POA: Diagnosis not present

## 2019-08-22 DIAGNOSIS — K259 Gastric ulcer, unspecified as acute or chronic, without hemorrhage or perforation: Secondary | ICD-10-CM | POA: Diagnosis not present

## 2019-08-29 DIAGNOSIS — H2513 Age-related nuclear cataract, bilateral: Secondary | ICD-10-CM | POA: Diagnosis not present

## 2019-08-29 DIAGNOSIS — H401131 Primary open-angle glaucoma, bilateral, mild stage: Secondary | ICD-10-CM | POA: Diagnosis not present

## 2019-08-29 DIAGNOSIS — H16223 Keratoconjunctivitis sicca, not specified as Sjogren's, bilateral: Secondary | ICD-10-CM | POA: Diagnosis not present

## 2019-08-29 DIAGNOSIS — H25013 Cortical age-related cataract, bilateral: Secondary | ICD-10-CM | POA: Diagnosis not present

## 2019-09-01 DIAGNOSIS — M19011 Primary osteoarthritis, right shoulder: Secondary | ICD-10-CM | POA: Diagnosis not present

## 2019-09-02 DIAGNOSIS — G4733 Obstructive sleep apnea (adult) (pediatric): Secondary | ICD-10-CM | POA: Diagnosis not present

## 2019-09-05 DIAGNOSIS — R198 Other specified symptoms and signs involving the digestive system and abdomen: Secondary | ICD-10-CM | POA: Diagnosis not present

## 2019-09-05 DIAGNOSIS — K319 Disease of stomach and duodenum, unspecified: Secondary | ICD-10-CM | POA: Diagnosis not present

## 2019-09-05 DIAGNOSIS — K2950 Unspecified chronic gastritis without bleeding: Secondary | ICD-10-CM | POA: Diagnosis not present

## 2019-09-05 DIAGNOSIS — K2970 Gastritis, unspecified, without bleeding: Secondary | ICD-10-CM | POA: Diagnosis not present

## 2019-09-05 DIAGNOSIS — K259 Gastric ulcer, unspecified as acute or chronic, without hemorrhage or perforation: Secondary | ICD-10-CM | POA: Diagnosis not present

## 2019-10-02 DIAGNOSIS — G4733 Obstructive sleep apnea (adult) (pediatric): Secondary | ICD-10-CM | POA: Diagnosis not present

## 2019-10-03 DIAGNOSIS — R197 Diarrhea, unspecified: Secondary | ICD-10-CM | POA: Diagnosis not present

## 2019-10-04 DIAGNOSIS — R197 Diarrhea, unspecified: Secondary | ICD-10-CM | POA: Diagnosis not present

## 2019-10-04 DIAGNOSIS — Z20828 Contact with and (suspected) exposure to other viral communicable diseases: Secondary | ICD-10-CM | POA: Diagnosis not present

## 2019-10-10 DIAGNOSIS — H401112 Primary open-angle glaucoma, right eye, moderate stage: Secondary | ICD-10-CM | POA: Diagnosis not present

## 2019-10-10 DIAGNOSIS — H2513 Age-related nuclear cataract, bilateral: Secondary | ICD-10-CM | POA: Diagnosis not present

## 2019-10-10 DIAGNOSIS — H25013 Cortical age-related cataract, bilateral: Secondary | ICD-10-CM | POA: Diagnosis not present

## 2019-10-10 DIAGNOSIS — H401121 Primary open-angle glaucoma, left eye, mild stage: Secondary | ICD-10-CM | POA: Diagnosis not present

## 2019-11-13 DIAGNOSIS — E785 Hyperlipidemia, unspecified: Secondary | ICD-10-CM | POA: Diagnosis not present

## 2019-11-13 DIAGNOSIS — G479 Sleep disorder, unspecified: Secondary | ICD-10-CM | POA: Diagnosis not present

## 2019-11-13 DIAGNOSIS — F5221 Male erectile disorder: Secondary | ICD-10-CM | POA: Diagnosis not present

## 2019-11-13 DIAGNOSIS — M19011 Primary osteoarthritis, right shoulder: Secondary | ICD-10-CM | POA: Diagnosis not present

## 2019-12-19 DIAGNOSIS — E785 Hyperlipidemia, unspecified: Secondary | ICD-10-CM | POA: Diagnosis not present

## 2019-12-19 DIAGNOSIS — F5221 Male erectile disorder: Secondary | ICD-10-CM | POA: Diagnosis not present

## 2019-12-19 DIAGNOSIS — G479 Sleep disorder, unspecified: Secondary | ICD-10-CM | POA: Diagnosis not present

## 2019-12-19 DIAGNOSIS — M19011 Primary osteoarthritis, right shoulder: Secondary | ICD-10-CM | POA: Diagnosis not present

## 2019-12-22 DIAGNOSIS — Z713 Dietary counseling and surveillance: Secondary | ICD-10-CM | POA: Diagnosis not present

## 2019-12-22 DIAGNOSIS — M47816 Spondylosis without myelopathy or radiculopathy, lumbar region: Secondary | ICD-10-CM | POA: Diagnosis not present

## 2019-12-25 DIAGNOSIS — E291 Testicular hypofunction: Secondary | ICD-10-CM | POA: Diagnosis not present

## 2019-12-25 DIAGNOSIS — N401 Enlarged prostate with lower urinary tract symptoms: Secondary | ICD-10-CM | POA: Diagnosis not present

## 2019-12-25 DIAGNOSIS — N138 Other obstructive and reflux uropathy: Secondary | ICD-10-CM | POA: Diagnosis not present

## 2019-12-25 DIAGNOSIS — N529 Male erectile dysfunction, unspecified: Secondary | ICD-10-CM | POA: Diagnosis not present

## 2020-01-02 DIAGNOSIS — M47816 Spondylosis without myelopathy or radiculopathy, lumbar region: Secondary | ICD-10-CM | POA: Diagnosis not present

## 2020-01-09 DIAGNOSIS — M47816 Spondylosis without myelopathy or radiculopathy, lumbar region: Secondary | ICD-10-CM | POA: Diagnosis not present

## 2020-01-10 DIAGNOSIS — E291 Testicular hypofunction: Secondary | ICD-10-CM | POA: Diagnosis not present

## 2020-01-12 DIAGNOSIS — M47816 Spondylosis without myelopathy or radiculopathy, lumbar region: Secondary | ICD-10-CM | POA: Diagnosis not present

## 2020-01-12 DIAGNOSIS — Z713 Dietary counseling and surveillance: Secondary | ICD-10-CM | POA: Diagnosis not present

## 2020-02-09 DIAGNOSIS — G479 Sleep disorder, unspecified: Secondary | ICD-10-CM | POA: Diagnosis not present

## 2020-02-09 DIAGNOSIS — E785 Hyperlipidemia, unspecified: Secondary | ICD-10-CM | POA: Diagnosis not present

## 2020-02-09 DIAGNOSIS — E038 Other specified hypothyroidism: Secondary | ICD-10-CM | POA: Diagnosis not present

## 2020-02-14 DIAGNOSIS — E038 Other specified hypothyroidism: Secondary | ICD-10-CM | POA: Diagnosis not present

## 2020-02-14 DIAGNOSIS — E785 Hyperlipidemia, unspecified: Secondary | ICD-10-CM | POA: Diagnosis not present

## 2020-02-26 DIAGNOSIS — E785 Hyperlipidemia, unspecified: Secondary | ICD-10-CM | POA: Diagnosis not present

## 2020-02-26 DIAGNOSIS — G479 Sleep disorder, unspecified: Secondary | ICD-10-CM | POA: Diagnosis not present

## 2020-03-08 DIAGNOSIS — M19011 Primary osteoarthritis, right shoulder: Secondary | ICD-10-CM | POA: Diagnosis not present

## 2020-03-18 DIAGNOSIS — E785 Hyperlipidemia, unspecified: Secondary | ICD-10-CM | POA: Diagnosis not present

## 2020-03-18 DIAGNOSIS — G479 Sleep disorder, unspecified: Secondary | ICD-10-CM | POA: Diagnosis not present

## 2020-03-18 DIAGNOSIS — M48062 Spinal stenosis, lumbar region with neurogenic claudication: Secondary | ICD-10-CM | POA: Diagnosis not present

## 2020-03-25 DIAGNOSIS — N529 Male erectile dysfunction, unspecified: Secondary | ICD-10-CM | POA: Diagnosis not present

## 2020-03-25 DIAGNOSIS — N401 Enlarged prostate with lower urinary tract symptoms: Secondary | ICD-10-CM | POA: Diagnosis not present

## 2020-03-25 DIAGNOSIS — N138 Other obstructive and reflux uropathy: Secondary | ICD-10-CM | POA: Diagnosis not present

## 2020-03-25 DIAGNOSIS — E291 Testicular hypofunction: Secondary | ICD-10-CM | POA: Diagnosis not present

## 2020-05-18 DIAGNOSIS — F419 Anxiety disorder, unspecified: Secondary | ICD-10-CM | POA: Diagnosis not present

## 2020-05-19 DIAGNOSIS — E785 Hyperlipidemia, unspecified: Secondary | ICD-10-CM | POA: Diagnosis not present

## 2020-05-25 DIAGNOSIS — Z1211 Encounter for screening for malignant neoplasm of colon: Secondary | ICD-10-CM | POA: Diagnosis not present

## 2020-05-25 DIAGNOSIS — E038 Other specified hypothyroidism: Secondary | ICD-10-CM | POA: Diagnosis not present

## 2020-05-25 DIAGNOSIS — G479 Sleep disorder, unspecified: Secondary | ICD-10-CM | POA: Diagnosis not present

## 2020-05-25 DIAGNOSIS — E785 Hyperlipidemia, unspecified: Secondary | ICD-10-CM | POA: Diagnosis not present

## 2020-06-01 DIAGNOSIS — I2510 Atherosclerotic heart disease of native coronary artery without angina pectoris: Secondary | ICD-10-CM | POA: Diagnosis not present

## 2020-06-01 DIAGNOSIS — I10 Essential (primary) hypertension: Secondary | ICD-10-CM | POA: Diagnosis not present

## 2020-06-01 DIAGNOSIS — I5032 Chronic diastolic (congestive) heart failure: Secondary | ICD-10-CM | POA: Diagnosis not present

## 2020-06-15 DIAGNOSIS — F331 Major depressive disorder, recurrent, moderate: Secondary | ICD-10-CM | POA: Diagnosis not present

## 2020-06-16 DIAGNOSIS — R1310 Dysphagia, unspecified: Secondary | ICD-10-CM | POA: Diagnosis not present

## 2020-06-16 DIAGNOSIS — Z86010 Personal history of colonic polyps: Secondary | ICD-10-CM | POA: Diagnosis not present

## 2020-06-16 DIAGNOSIS — R1032 Left lower quadrant pain: Secondary | ICD-10-CM | POA: Diagnosis not present

## 2020-06-25 DIAGNOSIS — F331 Major depressive disorder, recurrent, moderate: Secondary | ICD-10-CM | POA: Diagnosis not present

## 2020-06-28 DIAGNOSIS — K648 Other hemorrhoids: Secondary | ICD-10-CM | POA: Diagnosis not present

## 2020-06-28 DIAGNOSIS — Z86010 Personal history of colonic polyps: Secondary | ICD-10-CM | POA: Diagnosis not present

## 2020-06-28 DIAGNOSIS — K649 Unspecified hemorrhoids: Secondary | ICD-10-CM | POA: Diagnosis not present

## 2020-07-20 DIAGNOSIS — G479 Sleep disorder, unspecified: Secondary | ICD-10-CM | POA: Diagnosis not present

## 2020-07-20 DIAGNOSIS — E038 Other specified hypothyroidism: Secondary | ICD-10-CM | POA: Diagnosis not present

## 2020-08-21 DIAGNOSIS — R1032 Left lower quadrant pain: Secondary | ICD-10-CM | POA: Diagnosis not present

## 2020-08-21 DIAGNOSIS — K2970 Gastritis, unspecified, without bleeding: Secondary | ICD-10-CM | POA: Diagnosis not present

## 2020-08-27 DIAGNOSIS — M4316 Spondylolisthesis, lumbar region: Secondary | ICD-10-CM | POA: Diagnosis not present

## 2020-08-27 DIAGNOSIS — R1032 Left lower quadrant pain: Secondary | ICD-10-CM | POA: Diagnosis not present

## 2021-07-07 ENCOUNTER — Encounter (INDEPENDENT_AMBULATORY_CARE_PROVIDER_SITE_OTHER): Payer: Self-pay
